# Patient Record
Sex: Female | Born: 1970 | Race: Black or African American | Hispanic: No | Marital: Single | Smoking: Never smoker
Health system: Southern US, Community
[De-identification: ages and names within clinical notes are randomized; demographics above are authoritative.]

## PROBLEM LIST (undated history)

## (undated) DIAGNOSIS — I10 Essential (primary) hypertension: Secondary | ICD-10-CM

## (undated) HISTORY — PX: CHOLECYSTECTOMY: SHX55

## (undated) HISTORY — PX: OTHER SURGICAL HISTORY: SHX169

---

## 2002-02-09 ENCOUNTER — Encounter (HOSPITAL_COMMUNITY): Admission: RE | Admit: 2002-02-09 | Discharge: 2002-03-11 | Payer: Self-pay | Admitting: Preventative Medicine

## 2002-07-26 ENCOUNTER — Emergency Department (HOSPITAL_COMMUNITY): Admission: EM | Admit: 2002-07-26 | Discharge: 2002-07-27 | Payer: Self-pay | Admitting: Emergency Medicine

## 2003-04-05 ENCOUNTER — Emergency Department (HOSPITAL_COMMUNITY): Admission: EM | Admit: 2003-04-05 | Discharge: 2003-04-05 | Payer: Self-pay | Admitting: Emergency Medicine

## 2003-07-23 ENCOUNTER — Encounter: Admission: RE | Admit: 2003-07-23 | Discharge: 2003-07-23 | Payer: Self-pay | Admitting: Family Medicine

## 2004-02-26 ENCOUNTER — Emergency Department (HOSPITAL_COMMUNITY): Admission: EM | Admit: 2004-02-26 | Discharge: 2004-02-26 | Payer: Self-pay | Admitting: *Deleted

## 2010-02-01 ENCOUNTER — Emergency Department (HOSPITAL_COMMUNITY): Admission: EM | Admit: 2010-02-01 | Discharge: 2010-02-01 | Payer: Self-pay | Admitting: Emergency Medicine

## 2010-06-24 ENCOUNTER — Encounter (INDEPENDENT_AMBULATORY_CARE_PROVIDER_SITE_OTHER): Payer: Self-pay | Admitting: *Deleted

## 2010-06-24 ENCOUNTER — Ambulatory Visit: Payer: Self-pay | Admitting: Internal Medicine

## 2010-06-24 LAB — CONVERTED CEMR LAB
BUN: 16 mg/dL (ref 6–23)
Basophils Relative: 0 % (ref 0–1)
Calcium: 9.2 mg/dL (ref 8.4–10.5)
Chloride: 104 meq/L (ref 96–112)
Creatinine, Ser: 0.75 mg/dL (ref 0.40–1.20)
Eosinophils Absolute: 0 10*3/uL (ref 0.0–0.7)
Eosinophils Relative: 1 % (ref 0–5)
HCT: 39.5 % (ref 36.0–46.0)
Hemoglobin: 12.9 g/dL (ref 12.0–15.0)
LDL Cholesterol: 86 mg/dL (ref 0–99)
MCHC: 32.7 g/dL (ref 30.0–36.0)
MCV: 91.4 fL (ref 78.0–100.0)
Monocytes Absolute: 0.4 10*3/uL (ref 0.1–1.0)
Monocytes Relative: 9 % (ref 3–12)
RBC: 4.32 M/uL (ref 3.87–5.11)
Triglycerides: 50 mg/dL (ref <150)

## 2010-12-09 LAB — URINALYSIS, ROUTINE W REFLEX MICROSCOPIC
Bilirubin Urine: NEGATIVE
Nitrite: POSITIVE — AB
Specific Gravity, Urine: 1.008 (ref 1.005–1.030)
pH: 7 (ref 5.0–8.0)

## 2010-12-09 LAB — URINE MICROSCOPIC-ADD ON

## 2010-12-09 LAB — URINE CULTURE

## 2011-08-23 ENCOUNTER — Emergency Department (HOSPITAL_COMMUNITY)
Admission: EM | Admit: 2011-08-23 | Discharge: 2011-08-23 | Disposition: A | Discharge status: Home / Self Care | Payer: Self-pay

## 2013-09-05 ENCOUNTER — Encounter (HOSPITAL_COMMUNITY): Payer: Self-pay | Admitting: Emergency Medicine

## 2013-09-05 ENCOUNTER — Emergency Department (HOSPITAL_COMMUNITY)
Admission: EM | Admit: 2013-09-05 | Discharge: 2013-09-05 | Disposition: A | Discharge status: Home / Self Care | Payer: Self-pay | Attending: Emergency Medicine | Admitting: Emergency Medicine

## 2013-09-05 DIAGNOSIS — H9319 Tinnitus, unspecified ear: Secondary | ICD-10-CM | POA: Insufficient documentation

## 2013-09-05 DIAGNOSIS — R05 Cough: Secondary | ICD-10-CM | POA: Insufficient documentation

## 2013-09-05 DIAGNOSIS — R599 Enlarged lymph nodes, unspecified: Secondary | ICD-10-CM | POA: Insufficient documentation

## 2013-09-05 DIAGNOSIS — K029 Dental caries, unspecified: Secondary | ICD-10-CM | POA: Insufficient documentation

## 2013-09-05 DIAGNOSIS — Z79899 Other long term (current) drug therapy: Secondary | ICD-10-CM | POA: Insufficient documentation

## 2013-09-05 DIAGNOSIS — R51 Headache: Secondary | ICD-10-CM | POA: Insufficient documentation

## 2013-09-05 DIAGNOSIS — H6093 Unspecified otitis externa, bilateral: Secondary | ICD-10-CM

## 2013-09-05 DIAGNOSIS — H60399 Other infective otitis externa, unspecified ear: Secondary | ICD-10-CM | POA: Insufficient documentation

## 2013-09-05 DIAGNOSIS — R059 Cough, unspecified: Secondary | ICD-10-CM | POA: Insufficient documentation

## 2013-09-05 MED ORDER — OFLOXACIN 0.3 % OT SOLN: 5.0000 [drp] | Freq: Two times a day (BID) | OTIC | Status: AC

## 2013-09-05 MED ORDER — HYDROCODONE-ACETAMINOPHEN 5-325 MG PO TABS: 1.0000 | ORAL_TABLET | ORAL | Status: DC | PRN

## 2013-09-05 MED ORDER — ANTIPYRINE-BENZOCAINE 5.4-1.4 % OT SOLN: 3.0000 [drp] | OTIC | Status: DC | PRN

## 2013-09-05 NOTE — Progress Notes (Signed)
   CARE MANAGEMENT ED NOTE 09/05/2013  Patient:  Vanessa Benitez,Vanessa Benitez   Account Number:  [id number]  Date Initiated:  09/05/2013  Documentation initiated by:  Radford Pax  Subjective/Objective Assessment:   Patient presents to Ed with swelling, itching, decreased hearing and tender ears.     Subjective/Objective Assessment Detail:     Action/Plan:   Action/Plan Detail:   Anticipated DC Date:       Status Recommendation to Physician:   Result of Recommendation:    Other ED Services  Consult Working Plan    DC Planning Services  Other  PCP issues    Choice offered to / List presented to:            Status of service:  Completed, signed off  ED Comments:   ED Comments Detail:  Patient confirms she does not have a pcp or insurance. [facility] provided patient with a list of pcps who accept self pay patients, list of financial assistance in the community such as local churches and [employer], urban ministries, list  discounted pharmacies, websites needymeds.org and Good Rx .com for medication assistance, information regarding Affordablle Care Act and Medicaid for insurance.  Also provided patient with phone number to inquire about the orange card and provided patient with discount RX card.  No further CM needs at this time.

## 2013-09-05 NOTE — ED Provider Notes (Signed)
CSN: [id number]     Arrival date & time 09/05/13  1952 History  This chart was scribed for non-physician practitioner Ruby Cola, PA-C, working with Shon Baton, MD by Dorothey Baseman, ED Scribe. This patient was seen in room WTR7/WTR7 and the patient's care was started at 9:38 PM.    Chief Complaint  Patient presents with  . Otalgia   The history is provided by the patient. No language interpreter was used.   HPI Comments: Vanessa Benitez is a 42 y.o. female who presents to the Emergency Department complaining of a constant, itching pain to the bilateral ears with associated decreased hearing, tinnitus, and swelling around the ears onset 4 days ago. She also reports an intermittent, stabbing pain to the bilateral upper dentition that she states may be due to her wisdom teeth. Patient reports a mild, intermittent cough and some sinus pressure to the forehead area. Patient reports an associated tactile fever and some mild rhinorrhea 4 days ago that has since resolved. She reports taking Benadryl at home without relief. Patient reports a history of ear infections that was treated with antibiotic ear drops. Patient reports that she does use Q-tips in the ears. She denies any ear drainage or sore throat. Patient denies any allergies. Patient has no other pertinent medical history.   History reviewed. No pertinent past medical history. Past Surgical History  Procedure Laterality Date  . Cholecystectomy    . Cyst removed     Family History  Problem Relation Age of Onset  . Hypertension Other   . Cancer Other   . Diabetes Other    History  Substance Use Topics  . Smoking status: Never Smoker   . Smokeless tobacco: Not on file  . Alcohol Use: Yes     Comment: rare   OB History   Grav Para Term Preterm Abortions TAB SAB Ect Mult Living                 Review of Systems  A complete 10 system review of systems was obtained and all systems are negative except as noted in the HPI  and PMH.   Allergies  Review of patient's allergies indicates no known allergies.  Home Medications   Current Outpatient Rx  Name  Route  Sig  Dispense  Refill  . diphenhydrAMINE (BENADRYL) 12.5 MG/5ML elixir   Oral   Take 25 mg by mouth 4 (four) times daily as needed (itching).         Marland Kitchen FLUoxetine (PROZAC) 20 MG capsule   Oral   Take 20 mg by mouth daily.         . traZODone (DESYREL) 50 MG tablet   Oral   Take 50 mg by mouth at bedtime.          Triage Vitals: BP 137/96  Pulse 94  Temp(Src) 98.7 F (37.1 C) (Oral)  Resp 22  Ht 5\' 2"  (1.575 m)  Wt 234 lb 2 oz (106.198 kg)  BMI 42.81 kg/m2  SpO2 98%  LMP 09/05/2013  Physical Exam  Nursing note and vitals reviewed. Constitutional: She is oriented to person, place, and time. She appears well-developed and well-nourished. No distress.  HENT:  Head: Normocephalic and atraumatic.  Tissue of EAC, particularly at outlet, is edematous and lumpy, almost with the appearance of cauliflower, bilaterally.  No erythema.  There is what appears to be dried drainage deep in canal on L; no active drainage.  L side is ttp.   TM nml  bilaterally.  No obvious edema/erythema around ear, including mastoid.  Post-auricular and cervical lymphadenopathy on right.  Tenderness bilateral TMJ.  Decay and mild tenderness bilateral upper 2nd molars.  No trismus but ROM of jaw decreased d/t pain.   Eyes:  Normal appearance  Neck: Normal range of motion.  Pulmonary/Chest: Effort normal.  Musculoskeletal: Normal range of motion.  Neurological: She is alert and oriented to person, place, and time.  Psychiatric: She has a normal mood and affect. Her behavior is normal.    ED Course  Procedures (including critical care time)  DIAGNOSTIC STUDIES: Oxygen Saturation is 98% on room air, normal by my interpretation.    COORDINATION OF CARE: 9:43 PM- Will discharge patient with a topical antibiotic to treat an external ear infection and pain  medication to manage symptoms. Advised patient to follow up with the referred ENT specialist. Return precautions given. Discussed treatment plan with patient at bedside and patient verbalized agreement.     Labs Review Labs Reviewed - No data to display Imaging Review No results found.  EKG Interpretation   None       MDM   1. Otitis externa, bilateral    Healthy 42yo F presents w/ bilateral ear pain/pruritis that is aggravated by ROM of jaw and associated w/ hearing impairment.  Has chronic pruritis of ears and frequently scratches w/ fingers or a q-tip.  On exam, lumpy edema and what appears to be crusted drainage bilateral EAC.  No erythema of skin.  Cervical and post-auricular lymphadenopathy on right.  No trismus but limited ROM of jaw d/t ear pain, as well as tenderness of bilateral upper molars and TMJ.  Suspect that patient has otitis externa, likely from digital trauma, but exam findings almost appear chronic, particularly because of lack of erythema and active drainage.  Recommended f/u w/ ENT.  Prescribed ofloxacin, auralgan and hydrocodone.  Return precautions discussed.    I personally performed the services described in this documentation, which was scribed in my presence. The recorded information has been reviewed and is accurate.     Otilio Miu, PA-C 09/05/13 2244

## 2013-09-05 NOTE — ED Provider Notes (Signed)
Medical screening examination/treatment/procedure(s) were performed by non-physician practitioner and as supervising physician I was immediately available for consultation/collaboration.  EKG Interpretation   None        Karna Abed F Deneka Greenwalt, MD 09/05/13 2329 

## 2013-09-05 NOTE — ED Notes (Signed)
Pt is c/o bilateral earache  Pt states it started on Friday  Pt states she is having a hard time hearing and states her face is swollen

## 2013-10-28 ENCOUNTER — Emergency Department (HOSPITAL_COMMUNITY)
Admission: EM | Admit: 2013-10-28 | Discharge: 2013-10-28 | Disposition: A | Discharge status: Home / Self Care | Payer: Self-pay | Attending: Emergency Medicine | Admitting: Emergency Medicine

## 2013-10-28 ENCOUNTER — Encounter (HOSPITAL_COMMUNITY): Payer: Self-pay | Admitting: Emergency Medicine

## 2013-10-28 DIAGNOSIS — R350 Frequency of micturition: Secondary | ICD-10-CM | POA: Insufficient documentation

## 2013-10-28 DIAGNOSIS — J029 Acute pharyngitis, unspecified: Secondary | ICD-10-CM | POA: Insufficient documentation

## 2013-10-28 DIAGNOSIS — H6690 Otitis media, unspecified, unspecified ear: Secondary | ICD-10-CM | POA: Insufficient documentation

## 2013-10-28 DIAGNOSIS — H5789 Other specified disorders of eye and adnexa: Secondary | ICD-10-CM | POA: Insufficient documentation

## 2013-10-28 DIAGNOSIS — H6691 Otitis media, unspecified, right ear: Secondary | ICD-10-CM

## 2013-10-28 DIAGNOSIS — R630 Anorexia: Secondary | ICD-10-CM | POA: Insufficient documentation

## 2013-10-28 DIAGNOSIS — R110 Nausea: Secondary | ICD-10-CM | POA: Insufficient documentation

## 2013-10-28 LAB — RAPID STREP SCREEN (MED CTR MEBANE ONLY): Streptococcus, Group A Screen (Direct): NEGATIVE

## 2013-10-28 MED ORDER — AMOXICILLIN 500 MG PO CAPS: 500.0000 mg | ORAL_CAPSULE | Freq: Three times a day (TID) | ORAL | Status: DC

## 2013-10-28 MED ORDER — GUAIFENESIN-CODEINE 100-10 MG/5ML PO SYRP: 5.0000 mL | ORAL_SOLUTION | Freq: Three times a day (TID) | ORAL | Status: DC | PRN

## 2013-10-28 NOTE — ED Notes (Signed)
Pt c/o sore throat, body aches and bil ear pain since Tuesday. No fevers. Theraflu PTA.

## 2013-10-28 NOTE — ED Provider Notes (Signed)
CSN: [id number]     Arrival date & time 10/28/13  1618 History  This chart was scribed for non-physician practitioner Ashley Murrain, NP working with Elmer Sow, MD by Anastasia Pall, ED scribe. This patient was seen in room TR05C/TR05C and the patient's care was started at 7:19 PM.    Chief Complaint  Patient presents with  . Sore Throat  . Generalized Body Aches  . Otalgia   (Consider location/radiation/quality/duration/timing/severity/associated sxs/prior Treatment) The history is provided by the patient. No language interpreter was used.   HPI Comments: Vanessa Benitez is a 43 y.o. female who presents to the Emergency Department complaining of constant sore throat, onset 5 days ago, with a pain severity of 8/10 when swallowing. She reports associated fever 5 days ago in the evening around 6:00pm, but states it subsided the next day. She reports nausea 4 days ago, with decreased appetite. She reports diarrhea 3 days ago, but states it subsided after 24 hours. She also reports associated nasal congestion, intermittent sneezing, with dark yellow/bloody discharge, intermittent cough, bilateral ear pain, bilateral eye redness, and urinary frequency. She reports h/o recurring cough. She states her right ear is more painful than the left. She reports her daughter recently became sick, but did not need antibiotics. She reports her boss recently came down with strep. She denies blurry vision, eye discharge, vomiting, abdominal pain, dysuria, and any other associated symptoms. She denies any known allergies.   PCP - No primary provider on file.  History reviewed. No pertinent past medical history. Past Surgical History  Procedure Laterality Date  . Cholecystectomy    . Cyst removed     Family History  Problem Relation Age of Onset  . Hypertension Other   . Cancer Other   . Diabetes Other    History  Substance Use Topics  . Smoking status: Never Smoker   . Smokeless tobacco: Not on file  .  Alcohol Use: Yes     Comment: rare   OB History   Grav Para Term Preterm Abortions TAB SAB Ect Mult Living                 Review of Systems  Constitutional: Positive for fever and appetite change (decreased).  HENT: Positive for congestion (nasal), ear pain (bilateral), sneezing and sore throat.   Eyes: Positive for redness (bilateral). Negative for pain, discharge and visual disturbance.  Respiratory: Positive for cough.   Cardiovascular: Negative for chest pain.  Gastrointestinal: Positive for nausea and diarrhea (a few days ago but has resolved). Negative for vomiting and abdominal pain.  Genitourinary: Positive for frequency. Negative for dysuria and decreased urine volume.  Skin: Negative for rash.  Neurological: Negative for dizziness and headaches.  Psychiatric/Behavioral: Negative for confusion.    Allergies  Review of patient's allergies indicates no known allergies.  Home Medications   No current outpatient prescriptions on file. BP 128/82  Pulse 102  Temp(Src) 98.3 F (36.8 C) (Oral)  Resp 18  Wt 230 lb (104.327 kg)  SpO2 100%  LMP 10/06/2013  Physical Exam  Nursing note and vitals reviewed. Constitutional: She is oriented to person, place, and time. She appears well-developed and well-nourished. No distress.  HENT:  Head: Normocephalic and atraumatic.  Right Ear: External ear normal. Tympanic membrane is erythematous and retracted.  Left Ear: Tympanic membrane, external ear and ear canal normal. Tympanic membrane is not erythematous and not retracted.  Nose: Nose normal.  Mouth/Throat: Uvula is midline. Posterior oropharyngeal erythema present. No oropharyngeal  exudate.  Eyes: Conjunctivae and EOM are normal. Pupils are equal, round, and reactive to light. No scleral icterus.  Neck: Neck supple. No tracheal deviation present. No thyromegaly present.  Cardiovascular: Normal rate, regular rhythm and normal heart sounds.   No murmur heard. Pulmonary/Chest:  Effort normal and breath sounds normal. No respiratory distress. She has no wheezes. She has no rales.  Abdominal: Soft. There is no tenderness. There is no CVA tenderness.  Musculoskeletal: Normal range of motion.  Lymphadenopathy:    She has cervical adenopathy (greater on right than left).  Neurological: She is alert and oriented to person, place, and time.  Skin: Skin is warm and dry.  Psychiatric: She has a normal mood and affect. Her behavior is normal.    ED Course  Procedures (including critical care time)  DIAGNOSTIC STUDIES: Oxygen Saturation is 100% on room air, normal by my interpretation.    COORDINATION OF CARE: 7:25 PM-Discussed treatment plan which includes strep screen, antibiotic, and cough medication with pt at bedside and pt agreed to plan.   Results for orders placed during the hospital encounter of 10/28/13  RAPID STREP SCREEN      Result Value Range   Streptococcus, Group A Screen (Direct) NEGATIVE  NEGATIVE    MDM  43 y.o. female with sore throat, ear ache and generalized body aches x 4 days. I have reviewed this patient's vital signs, nurses notes, appropriate labs and discussed findings and plan of care with the patient. She voices understanding. Stable for discharge without further screening. Will treat otitis and URI.    Medication List         amoxicillin 500 MG capsule  Commonly known as:  AMOXIL  Take 1 capsule (500 mg total) by mouth 3 (three) times daily.     guaiFENesin-codeine 100-10 MG/5ML syrup  Commonly known as:  ROBITUSSIN AC  Take 5 mLs by mouth 3 (three) times daily as needed for cough.      I personally performed the services described in this documentation, which was scribed in my presence. The recorded information has been reviewed and is accurate.   [address] [place] H[place], [place] 10/29/13 [phone]

## 2013-10-28 NOTE — ED Notes (Signed)
The pt remains in peds with her child

## 2013-10-29 NOTE — ED Provider Notes (Signed)
Medical screening examination/treatment/procedure(s) were performed by non-physician practitioner and as supervising physician I was immediately available for consultation/collaboration.  EKG Interpretation   None         Elmer Sow, MD 10/29/13 [phone]

## 2013-10-31 LAB — CULTURE, GROUP A STREP

## 2014-05-23 ENCOUNTER — Encounter (HOSPITAL_COMMUNITY): Payer: Self-pay | Admitting: Emergency Medicine

## 2014-05-23 ENCOUNTER — Emergency Department (HOSPITAL_COMMUNITY)
Admission: EM | Admit: 2014-05-23 | Discharge: 2014-05-24 | Disposition: A | Discharge status: Home / Self Care | Payer: Self-pay | Attending: Emergency Medicine | Admitting: Emergency Medicine

## 2014-05-23 ENCOUNTER — Emergency Department (HOSPITAL_COMMUNITY): Payer: Self-pay

## 2014-05-23 DIAGNOSIS — J159 Unspecified bacterial pneumonia: Secondary | ICD-10-CM | POA: Insufficient documentation

## 2014-05-23 DIAGNOSIS — R509 Fever, unspecified: Secondary | ICD-10-CM | POA: Insufficient documentation

## 2014-05-23 DIAGNOSIS — M549 Dorsalgia, unspecified: Secondary | ICD-10-CM | POA: Insufficient documentation

## 2014-05-23 DIAGNOSIS — J189 Pneumonia, unspecified organism: Secondary | ICD-10-CM

## 2014-05-23 MED ORDER — IBUPROFEN 800 MG PO TABS
800.0000 mg | ORAL_TABLET | Freq: Once | ORAL | Status: AC
  Administered 2014-05-23: 800 mg via ORAL
  Filled 2014-05-23: qty 1

## 2014-05-23 MED ORDER — ACETAMINOPHEN 325 MG PO TABS
650.0000 mg | ORAL_TABLET | Freq: Once | ORAL | Status: AC
  Administered 2014-05-23: 650 mg via ORAL
  Filled 2014-05-23: qty 2

## 2014-05-23 NOTE — ED Notes (Signed)
Pt states that she began to have body aches, cough, chills, lower back pain that began on Thurs; pt states it is a dry cough

## 2014-05-24 LAB — CBC WITH DIFFERENTIAL/PLATELET
Basophils Absolute: 0 10*3/uL (ref 0.0–0.1)
Basophils Relative: 0 % (ref 0–1)
Eosinophils Absolute: 0 10*3/uL (ref 0.0–0.7)
Eosinophils Relative: 0 % (ref 0–5)
HCT: 40.9 % (ref 36.0–46.0)
HEMOGLOBIN: 13.9 g/dL (ref 12.0–15.0)
LYMPHS ABS: 1.3 10*3/uL (ref 0.7–4.0)
Lymphocytes Relative: 14 % (ref 12–46)
MCH: 29.8 pg (ref 26.0–34.0)
MCHC: 34 g/dL (ref 30.0–36.0)
MCV: 87.6 fL (ref 78.0–100.0)
Monocytes Absolute: 0.6 10*3/uL (ref 0.1–1.0)
Monocytes Relative: 6 % (ref 3–12)
NEUTROS PCT: 80 % — AB (ref 43–77)
Neutro Abs: 7.4 10*3/uL (ref 1.7–7.7)
Platelets: 227 10*3/uL (ref 150–400)
RBC: 4.67 MIL/uL (ref 3.87–5.11)
RDW: 13.6 % (ref 11.5–15.5)
WBC: 9.3 10*3/uL (ref 4.0–10.5)

## 2014-05-24 LAB — BASIC METABOLIC PANEL
Anion gap: 20 — ABNORMAL HIGH (ref 5–15)
BUN: 15 mg/dL (ref 6–23)
CO2: 20 meq/L (ref 19–32)
Calcium: 9.6 mg/dL (ref 8.4–10.5)
Chloride: 94 mEq/L — ABNORMAL LOW (ref 96–112)
Creatinine, Ser: 1.02 mg/dL (ref 0.50–1.10)
GFR calc Af Amer: 78 mL/min — ABNORMAL LOW (ref >90)
GFR calc non Af Amer: 67 mL/min — ABNORMAL LOW (ref >90)
Glucose, Bld: 114 mg/dL — ABNORMAL HIGH (ref 70–99)
POTASSIUM: 3.5 meq/L — AB (ref 3.7–5.3)
Sodium: 134 mEq/L — ABNORMAL LOW (ref 137–147)

## 2014-05-24 LAB — I-STAT CG4 LACTIC ACID, ED: Lactic Acid, Venous: 0.8 mmol/L (ref 0.5–2.2)

## 2014-05-24 MED ORDER — MORPHINE SULFATE 4 MG/ML IJ SOLN
4.0000 mg | Freq: Once | INTRAMUSCULAR | Status: DC
  Filled 2014-05-24: qty 1

## 2014-05-24 MED ORDER — FLUCONAZOLE 150 MG PO TABS: 150.0000 mg | ORAL_TABLET | Freq: Once | ORAL | Status: DC

## 2014-05-24 MED ORDER — HYDROCODONE-HOMATROPINE 5-1.5 MG/5ML PO SYRP: 5.0000 mL | ORAL_SOLUTION | Freq: Four times a day (QID) | ORAL | Status: DC | PRN

## 2014-05-24 MED ORDER — ONDANSETRON HCL 4 MG/2ML IJ SOLN
4.0000 mg | Freq: Once | INTRAMUSCULAR | Status: DC
  Filled 2014-05-24: qty 2

## 2014-05-24 MED ORDER — SODIUM CHLORIDE 0.9 % IV BOLUS (SEPSIS)
1000.0000 mL | Freq: Once | INTRAVENOUS | Status: AC
  Administered 2014-05-24: 1000 mL via INTRAVENOUS

## 2014-05-24 MED ORDER — LEVOFLOXACIN 500 MG PO TABS: 500.0000 mg | ORAL_TABLET | Freq: Every day | ORAL | Status: DC

## 2014-05-24 MED ORDER — LEVOFLOXACIN IN D5W 750 MG/150ML IV SOLN
750.0000 mg | Freq: Once | INTRAVENOUS | Status: AC
  Administered 2014-05-24: 750 mg via INTRAVENOUS
  Filled 2014-05-24: qty 150

## 2014-05-24 NOTE — ED Notes (Signed)
Family at bedside. 

## 2014-05-24 NOTE — Discharge Instructions (Signed)
Take Levaquin as directed until gone. Take hycodan as needed for cough. Refer to attached documents for more information. Return to the ED with worsening or concerning symptoms. Refer to attached documents for more information.

## 2014-05-24 NOTE — ED Provider Notes (Signed)
Medical screening examination/treatment/procedure(s) were performed by non-physician practitioner and as supervising physician I was immediately available for consultation/collaboration.   EKG Interpretation None       Kalman Drape, MD 05/24/14 [phone]

## 2014-05-24 NOTE — ED Provider Notes (Signed)
CSN: [id number]     Arrival date & time 05/23/14  2135 History   First MD Initiated Contact with Patient 05/23/14 2351     Chief Complaint  Patient presents with  . Cough  . Fever     (Consider location/radiation/quality/duration/timing/severity/associated sxs/prior Treatment) HPI Comments: Patient is a 43 year old female with no past medical history who presents with a 1 week history of cough and fever. Symptoms started gradually and progressively worsened since the onset. The cough is dry and hacking. She is unsure how high her fever has been at home. She tried OTC medication for symptoms without relief. She reports associated body aches, chills, and lower back pain. No aggravating/alleviating factors. Patient's husband is present who reports he was admitted to the hospital about 3 weeks ago for heart failure and contracted pneumonia during his admission. He thinks he may have passed the infection to his wife.    History reviewed. No pertinent past medical history. Past Surgical History  Procedure Laterality Date  . Cholecystectomy    . Cyst removed     Family History  Problem Relation Age of Onset  . Hypertension Other   . Cancer Other   . Diabetes Other    History  Substance Use Topics  . Smoking status: Never Smoker   . Smokeless tobacco: Not on file  . Alcohol Use: Yes     Comment: rare   OB History   Grav Para Term Preterm Abortions TAB SAB Ect Mult Living                 Review of Systems  Constitutional: Positive for fever. Negative for chills and fatigue.  HENT: Negative for trouble swallowing.   Eyes: Negative for visual disturbance.  Respiratory: Positive for cough. Negative for shortness of breath.   Cardiovascular: Negative for chest pain and palpitations.  Gastrointestinal: Negative for nausea, vomiting, abdominal pain and diarrhea.  Genitourinary: Negative for dysuria and difficulty urinating.  Musculoskeletal: Positive for back pain. Negative for  arthralgias and neck pain.  Skin: Negative for color change.  Neurological: Negative for dizziness and weakness.  Psychiatric/Behavioral: Negative for dysphoric mood.      Allergies  Review of patient's allergies indicates no known allergies.  Home Medications   Prior to Admission medications   Medication Sig Start Date End Date Taking? Authorizing Provider  aspirin-acetaminophen-caffeine (EXCEDRIN MIGRAINE) [phone] MG per tablet Take 1 tablet by mouth every 6 (six) hours as needed for headache.   Yes Historical Provider, MD  Pseudoeph-Doxylamine-DM-APAP (NYQUIL PO) Take 30 mLs by mouth every 4 (four) hours as needed (flu-like symptoms).   Yes Historical Provider, MD   BP 146/97  Pulse 127  Temp(Src) 100.3 F (37.9 C) (Oral)  Resp 20  SpO2 99%  LMP 05/06/2014 Physical Exam  Nursing note and vitals reviewed. Constitutional: She appears well-developed and well-nourished. No distress.  Patient appears uncomfortable.   HENT:  Head: Normocephalic and atraumatic.  Eyes: Conjunctivae and EOM are normal.  Neck: Normal range of motion.  Cardiovascular: Regular rhythm.  Exam reveals no gallop and no friction rub.   No murmur heard. tachycardic  Pulmonary/Chest: Effort normal and breath sounds normal. She has no wheezes. She has no rales. She exhibits no tenderness.  Abdominal: Soft. She exhibits no distension. There is no tenderness. There is no rebound.  Musculoskeletal: Normal range of motion.  Neurological: She is alert. Coordination normal.  Speech is goal-oriented. Moves limbs without ataxia.   Skin: Skin is warm and dry.  Psychiatric: She has a normal mood and affect. Her behavior is normal.    ED Course  Procedures (including critical care time) Labs Review Labs Reviewed  CBC WITH DIFFERENTIAL - Abnormal; Notable for the following:    Neutrophils Relative % 80 (*)    All other components within normal limits  BASIC METABOLIC PANEL - Abnormal; Notable for the  following:    Sodium 134 (*)    Potassium 3.5 (*)    Chloride 94 (*)    Glucose, Bld 114 (*)    GFR calc non Af Amer 67 (*)    GFR calc Af Amer 78 (*)    Anion gap 20 (*)    All other components within normal limits  I-STAT CG4 LACTIC ACID, ED    Imaging Review Dg Chest 2 View  05/24/2014   CLINICAL DATA:  rule out pneumonia.  Fever and chest pressure.  EXAM: CHEST  2 VIEW  COMPARISON:  None.  FINDINGS: Mild left lower lobe/ retrocardiac opacity. No pleural effusion or pneumothorax. Cardiomediastinal contours within normal range. No acute osseous finding. Surgical clips right upper quadrant.  IMPRESSION: Mild left lower lobe opacity may reflect atelectasis or pneumonia.   Electronically Signed   By: Carlos Levering M.D.   On: 05/24/2014 00:21     EKG Interpretation None      MDM   Final diagnoses:  CAP (community acquired pneumonia)    12:54 AM Patient is tachycardic and febrile at this time. Patient's chest xray shows pneumonia. Patient given tylenol and ibuprofen here. Patient will have IV fluids, IV levaquin, and labs.   5:36 AM Patient's labs show increase in anion gap likely due to dehydration. Patient reports improvement of symptoms after receiving fluids and IV levaquin. No elevation of lactic acid. Patient will be discharged with levaquin and hycodan. Patient instructed to drink plenty of fluids and return to the ED with worsening or concerning symptoms.   Alvina Chou, [place] 05/24/14 [phone]

## 2015-01-18 ENCOUNTER — Emergency Department (HOSPITAL_COMMUNITY)
Admission: EM | Admit: 2015-01-18 | Discharge: 2015-01-19 | Disposition: A | Discharge status: Home / Self Care | Payer: Medicaid Other | Attending: Emergency Medicine | Admitting: Emergency Medicine

## 2015-01-18 ENCOUNTER — Encounter (HOSPITAL_COMMUNITY): Payer: Self-pay | Admitting: Emergency Medicine

## 2015-01-18 DIAGNOSIS — R0602 Shortness of breath: Secondary | ICD-10-CM | POA: Insufficient documentation

## 2015-01-18 DIAGNOSIS — I10 Essential (primary) hypertension: Secondary | ICD-10-CM | POA: Diagnosis not present

## 2015-01-18 DIAGNOSIS — R2243 Localized swelling, mass and lump, lower limb, bilateral: Secondary | ICD-10-CM | POA: Diagnosis not present

## 2015-01-18 DIAGNOSIS — R51 Headache: Secondary | ICD-10-CM | POA: Diagnosis present

## 2015-01-18 HISTORY — DX: Essential (primary) hypertension: I10

## 2015-01-18 LAB — BASIC METABOLIC PANEL
ANION GAP: 6 (ref 5–15)
BUN: 13 mg/dL (ref 6–23)
CO2: 23 mmol/L (ref 19–32)
CREATININE: 0.71 mg/dL (ref 0.50–1.10)
Calcium: 9 mg/dL (ref 8.4–10.5)
Chloride: 108 mmol/L (ref 96–112)
GFR calc Af Amer: >90 mL/min (ref >90)
Glucose, Bld: 95 mg/dL (ref 70–99)
POTASSIUM: 3.7 mmol/L (ref 3.5–5.1)
SODIUM: 137 mmol/L (ref 135–145)

## 2015-01-18 LAB — CBC
HEMATOCRIT: 38 % (ref 36.0–46.0)
HEMOGLOBIN: 12.6 g/dL (ref 12.0–15.0)
MCH: 29.7 pg (ref 26.0–34.0)
MCHC: 33.2 g/dL (ref 30.0–36.0)
MCV: 89.6 fL (ref 78.0–100.0)
PLATELETS: 223 10*3/uL (ref 150–400)
RBC: 4.24 MIL/uL (ref 3.87–5.11)
RDW: 14.3 % (ref 11.5–15.5)
WBC: 4.8 10*3/uL (ref 4.0–10.5)

## 2015-01-18 LAB — I-STAT TROPONIN, ED: Troponin i, poc: 0 ng/mL (ref 0.00–0.08)

## 2015-01-18 LAB — CBG MONITORING, ED: Glucose-Capillary: 103 mg/dL — ABNORMAL HIGH (ref 70–99)

## 2015-01-18 NOTE — ED Notes (Signed)
CBG registered 103 ON ED Glucometer

## 2015-01-18 NOTE — ED Notes (Signed)
Pt from home c/o hypertension, headache, bilateral leg edema, and shoulder blade pressure. Pt reports she has been under  Stress due to passing of mother. Pt reports heaviness in arms, all symptoms x 6 months.

## 2015-01-19 ENCOUNTER — Emergency Department (HOSPITAL_COMMUNITY): Payer: Medicaid Other

## 2015-01-19 LAB — BRAIN NATRIURETIC PEPTIDE: B Natriuretic Peptide: 12.2 pg/mL (ref 0.0–100.0)

## 2015-01-19 LAB — D-DIMER, QUANTITATIVE: D-Dimer, Quant: 0.61 ug/mL-FEU — ABNORMAL HIGH (ref 0.00–0.48)

## 2015-01-19 LAB — TROPONIN I: Troponin I: <0.03 ng/mL (ref <0.031)

## 2015-01-19 MED ORDER — IOHEXOL 350 MG/ML SOLN
100.0000 mL | Freq: Once | INTRAVENOUS | Status: AC | PRN
  Administered 2015-01-19: 100 mL via INTRAVENOUS

## 2015-01-19 NOTE — ED Notes (Signed)
Patient transported to CT 

## 2015-01-19 NOTE — ED Notes (Addendum)
Pt transported to Xray. 

## 2015-01-19 NOTE — ED Provider Notes (Signed)
CSN: [id number]     Arrival date & time 01/18/15  1912 History   First MD Initiated Contact with Patient 01/18/15 2357     Chief Complaint  Patient presents with  . Hypertension  . Headache  . Leg Swelling     (Consider location/radiation/quality/duration/timing/severity/associated sxs/prior Treatment) HPI  Vanessa Benitez is a 44 y.o. female with past medical history of hypertension presenting today with multiple complaints. Patient states she has had elevated blood pressures at home and a headache as well. She describes chest pressure that radiates to her bilateral shoulder blades. She has had shortness of breath and sleep orthopnea. Patient also describes bilateral lower extremity edema worse on the left side. This is on the setting of recently losing her mother, she believes it may be stress related. Patient denies any exertional component to her symptoms. Her sleep orthopnea has worsened over the last couple of months. She denies any long distance travel, recent surgeries, estrogen use, or history of pulmonary embolism. Patient has no further complaints.  10 Systems reviewed and are negative for acute change except as noted in the HPI.    Past Medical History  Diagnosis Date  . Hypertension    Past Surgical History  Procedure Laterality Date  . Cholecystectomy    . Cyst removed     Family History  Problem Relation Age of Onset  . Hypertension Other   . Cancer Other   . Diabetes Other    History  Substance Use Topics  . Smoking status: Never Smoker   . Smokeless tobacco: Not on file  . Alcohol Use: Yes     Comment: rare   OB History    No data available     Review of Systems    Allergies  Lactose intolerance (gi)  Home Medications   Prior to Admission medications   Medication Sig Start Date End Date Taking? Authorizing Provider  fluconazole (DIFLUCAN) 150 MG tablet Take 1 tablet (150 mg total) by mouth once. Patient not taking: Reported on 01/18/2015 05/24/14    Alvina Chou, PA-C  HYDROcodone-homatropine [facility]) 5-1.5 MG/5ML syrup Take 5 mLs by mouth every 6 (six) hours as needed for cough. Patient not taking: Reported on 01/18/2015 05/24/14   Alvina Chou, PA-C  levofloxacin (LEVAQUIN) 500 MG tablet Take 1 tablet (500 mg total) by mouth daily. Patient not taking: Reported on 01/18/2015 05/24/14   Alvina Chou, PA-C   BP 156/116 mmHg  Pulse 65  Temp(Src) 99 F (37.2 C) (Oral)  Resp 16  SpO2 72%  LMP 12/28/2014 Physical Exam  Constitutional: She is oriented to person, place, and time. She appears well-developed and well-nourished. No distress.  HENT:  Head: Normocephalic and atraumatic.  Nose: Nose normal.  Mouth/Throat: Oropharynx is clear and moist. No oropharyngeal exudate.  Eyes: Conjunctivae and EOM are normal. Pupils are equal, round, and reactive to light. No scleral icterus.  Neck: Normal range of motion. Neck supple. No JVD present. No tracheal deviation present. No thyromegaly present.  Cardiovascular: Normal rate, regular rhythm and normal heart sounds.  Exam reveals no gallop and no friction rub.   No murmur heard. Pulmonary/Chest: Effort normal and breath sounds normal. No respiratory distress. She has no wheezes. She exhibits no tenderness.  Abdominal: Soft. Bowel sounds are normal. She exhibits no distension and no mass. There is no tenderness. There is no rebound and no guarding.  Musculoskeletal: Normal range of motion. She exhibits edema. She exhibits no tenderness.  Lymphadenopathy:    She has no  cervical adenopathy.  Neurological: She is alert and oriented to person, place, and time. No cranial nerve deficit. She exhibits normal muscle tone.  Skin: Skin is warm and dry. No rash noted. She is not diaphoretic. No erythema. No pallor.  Nursing note and vitals reviewed.   ED Course  Procedures (including critical care time) Labs Review Labs Reviewed  D-DIMER, QUANTITATIVE - Abnormal; Notable for the following:     D-Dimer, Quant 0.61 (*)    All other components within normal limits  CBG MONITORING, ED - Abnormal; Notable for the following:    Glucose-Capillary 103 (*)    All other components within normal limits  CBC  BASIC METABOLIC PANEL  BRAIN NATRIURETIC PEPTIDE  TROPONIN I  Randolm Idol, ED    Imaging Review Dg Chest 2 View  01/19/2015   CLINICAL DATA:  Sternal chest tightness. Hypertension. Headache. Shortness of breath.  EXAM: CHEST  2 VIEW  COMPARISON:  05/23/2014  FINDINGS: Numerous leads and wires project over the chest. Midline trachea. Normal heart size and mediastinal contours.Sharp costophrenic angles. No pneumothorax. Clear lungs.  IMPRESSION: No active cardiopulmonary disease.   Electronically Signed   By: Abigail Miyamoto M.D.   On: 01/19/2015 02:01   Ct Angio Chest Pe W/cm &/or Wo Cm  01/19/2015   CLINICAL DATA:  Headache, bilateral lower extremity edema, shoulder pressure for 6 months. History of hypertension and stress.  EXAM: CT ANGIOGRAPHY CHEST WITH CONTRAST  TECHNIQUE: Multidetector CT imaging of the chest was performed using the standard protocol during bolus administration of intravenous contrast. Multiplanar CT image reconstructions and MIPs were obtained to evaluate the vascular anatomy.  CONTRAST:  163mL OMNIPAQUE IOHEXOL 350 MG/ML SOLN  COMPARISON:  Chest radiograph January 19, 2015 at 1630 hours  FINDINGS: PULMONARY ARTERY: Adequate contrast opacification of the pulmonary artery's. Main pulmonary artery is not enlarged. No pulmonary arterial filling defects to the level of the subsegmental branches.  MEDIASTINUM: Heart and pericardium are unremarkable, no right heart strain. Thoracic aorta is normal course and caliber, unremarkable. No lymphadenopathy by CT size criteria.  LUNGS: Tracheobronchial tree is patent, no pneumothorax. No pleural effusions, focal consolidations, pulmonary nodules or masses.  SOFT TISSUES AND OSSEOUS STRUCTURES: Small hiatal hernia. Visualized soft  tissues and included osseous structures appear normal.  Review of the MIP images confirms the above findings.  IMPRESSION: No acute pulmonary embolism nor acute cardiopulmonary process.   Electronically Signed   By: Elon Alas   On: 01/19/2015 04:23     EKG Interpretation   Date/Time:  Friday January 18 2015 19:52:19 EDT Ventricular Rate:  81 PR Interval:  153 QRS Duration: 79 QT Interval:  526 QTC Calculation: 611 R Axis:   34 Text Interpretation:  Sinus rhythm Borderline abnrm T, anterolateral leads  Prolonged QT interval No old tracing to compare Confirmed by Winfred Leeds   MD, SAM [phone]) on 01/18/2015 8:17:29 PM      MDM   Final diagnoses:  SOB (shortness of breath)    Patient since emergency department for signs and symptoms concerning for CHF versus pulmonary embolism. Swelling is bilateral so I doubt DVTs in both legs. Will obtain d-dimer to evaluate for possible PE. Patient will also be ruled out using the heart score. She is low risk with her history. Initial EKG does not show signs of ischemia, troponin is negative. Heart score is currently 2.  Repeat troponin is currently pending.  Repeat troponin is negative. Heart score remains 2. Patient is safe for discharge in  regards to ACS. CT scan is negative for pulmonary embolism, which was obtained due to a positive d-dimer. BMP is also normal. This can be a false-negative as the patient is obese. Patient be advised to see primary care physician regarding her symptoms within 3 days. Her vital signs were within her normal limits and she is safe for discharge.  Everlene Balls, MD 01/19/15 [phone]

## 2015-01-19 NOTE — Discharge Instructions (Signed)
Shortness of Breath Ms. Vanessa Benitez, see her primary care physician within 3 days for close follow-up. Your CT scan did not show any blood clots. If any symptoms worsen come back to emergency department immediately. Thank you. Shortness of breath means you have trouble breathing. Shortness of breath needs medical care right away. HOME CARE   Do not smoke.  Avoid being around chemicals or things (paint fumes, dust) that may bother your breathing.  Rest as needed. Slowly begin your normal activities.  Only take medicines as told by your doctor.  Keep all doctor visits as told. GET HELP RIGHT AWAY IF:   Your shortness of breath gets worse.  You feel lightheaded, pass out (faint), or have a cough that is not helped by medicine.  You cough up blood.  You have pain with breathing.  You have pain in your chest, arms, shoulders, or belly (abdomen).  You have a fever.  You cannot walk up stairs or exercise the way you normally do.  You do not get better in the time expected.  You have a hard time doing normal activities even with rest.  You have problems with your medicines.  You have any new symptoms. MAKE SURE YOU:  Understand these instructions.  Will watch your condition.  Will get help right away if you are not doing well or get worse. Document Released: 02/24/2008 Document Revised: 09/12/2013 Document Reviewed: 11/23/2011 [facility] Patient Information 2015 [place], [place]. This information is not intended to replace advice given to you by your health care provider. Make sure you discuss any questions you have with your health care provider.

## 2016-03-15 ENCOUNTER — Emergency Department (HOSPITAL_COMMUNITY): Payer: Self-pay

## 2016-03-15 ENCOUNTER — Emergency Department (HOSPITAL_COMMUNITY)
Admission: EM | Admit: 2016-03-15 | Discharge: 2016-03-15 | Disposition: A | Discharge status: Home / Self Care | Payer: Self-pay | Attending: Emergency Medicine | Admitting: Emergency Medicine

## 2016-03-15 ENCOUNTER — Encounter (HOSPITAL_COMMUNITY): Payer: Self-pay

## 2016-03-15 DIAGNOSIS — D1779 Benign lipomatous neoplasm of other sites: Secondary | ICD-10-CM | POA: Insufficient documentation

## 2016-03-15 DIAGNOSIS — M79671 Pain in right foot: Secondary | ICD-10-CM | POA: Insufficient documentation

## 2016-03-15 DIAGNOSIS — S9031XA Contusion of right foot, initial encounter: Secondary | ICD-10-CM

## 2016-03-15 DIAGNOSIS — S63502A Unspecified sprain of left wrist, initial encounter: Secondary | ICD-10-CM | POA: Insufficient documentation

## 2016-03-15 DIAGNOSIS — Y999 Unspecified external cause status: Secondary | ICD-10-CM | POA: Insufficient documentation

## 2016-03-15 DIAGNOSIS — Y929 Unspecified place or not applicable: Secondary | ICD-10-CM | POA: Insufficient documentation

## 2016-03-15 DIAGNOSIS — I10 Essential (primary) hypertension: Secondary | ICD-10-CM | POA: Insufficient documentation

## 2016-03-15 DIAGNOSIS — W228XXA Striking against or struck by other objects, initial encounter: Secondary | ICD-10-CM | POA: Insufficient documentation

## 2016-03-15 DIAGNOSIS — Y939 Activity, unspecified: Secondary | ICD-10-CM | POA: Insufficient documentation

## 2016-03-15 DIAGNOSIS — D171 Benign lipomatous neoplasm of skin and subcutaneous tissue of trunk: Secondary | ICD-10-CM

## 2016-03-15 NOTE — ED Notes (Signed)
Patient Alert and oriented X4. Stable and ambulatory. Patient verbalized understanding of the discharge instructions.  Patient belongings were taken by the patient. Splints and crutches applied prior to discharge by ortho tech

## 2016-03-15 NOTE — ED Provider Notes (Signed)
CSN: [id number]     Arrival date & time 03/15/16  1312 History   First MD Initiated Contact with Patient 03/15/16 1506     Chief Complaint  Patient presents with  . Wrist Pain  . Bloated     (Consider location/radiation/quality/duration/timing/severity/associated sxs/prior Treatment) HPI Comments: [name] is a 45 y.o. female with a PMHx of HTN and lactose intolerance, with a PSHx of cholecystectomy, who presents to the ED with multiple complaints. Her primary complaint is left wrist pain and right foot pain 1 week. Patient states that one year ago she dropped a drawer on her right foot, pain subsided shortly thereafter and she did not seek medical attention, but one week ago she began having pain in the same location where the drawer hit her foot. When she went to get up one morning, the pain kept her from bearing weight in the right foot and therefore she fell onto her left wrist. She describes the pain in her left wrist as 8/10 intermittent throbbing nonradiating pain, worse with taking it down with gravity, and somewhat relieved with Aleve and Motrin. Associated symptoms include intermittent swelling of the left wrist and right foot. She denies that she injured the right foot during the fall, denies any ankle injury during the fall. She denies any bruising, erythema or warmth, or any other symptoms associated.  Secondly she states that one week ago she was eating tostitos and noticed a swollen area on her right upper quadrant abdominal area. States that it occurs intermittently and usually after eating, no specific foods seem to trigger it but it seems to occur after every meal. She denies any pain in this location. No overlying skin changes to this location.  She denies any fevers, chills, chest pain, shortness breath, abdominal pain, N/V/D/C, obstipation, melena, hematochezia, dysuria, hematuria, vaginal bleeding or discharge, numbness, tingling, or focal weakness. She denies any recent  travel, sick contacts, suspicious food intake, alcohol use, or chronic NSAID use. LMP was 03/06/16.  Patient is a 45 y.o. female presenting with wrist pain. The history is provided by the patient and medical records. No language interpreter was used.  Wrist Pain This is a new problem. The current episode started in the past 7 days. The problem occurs constantly. The problem has been unchanged. Associated symptoms include arthralgias (R foot, L wrist) and joint swelling (R foot, L wrist). Pertinent negatives include no abdominal pain, chest pain, chills, fever, myalgias, nausea, numbness, urinary symptoms, vomiting or weakness. Exacerbated by: hanging wrist down with gravity. She has tried NSAIDs for the symptoms. The treatment provided moderate relief.    Past Medical History  Diagnosis Date  . Hypertension    Past Surgical History  Procedure Laterality Date  . Cholecystectomy    . Cyst removed     Family History  Problem Relation Age of Onset  . Hypertension Other   . Cancer Other   . Diabetes Other    Social History  Substance Use Topics  . Smoking status: Never Smoker   . Smokeless tobacco: None  . Alcohol Use: Yes     Comment: rare   OB History    No data available     Review of Systems  Constitutional: Negative for fever and chills.  Respiratory: Negative for shortness of breath.   Cardiovascular: Negative for chest pain.  Gastrointestinal: Negative for nausea, vomiting, abdominal pain, diarrhea, constipation and blood in stool.       +Swollen area in RUQ/R lateral abdomen after eating  Genitourinary: Negative for dysuria, hematuria, vaginal bleeding and vaginal discharge.  Musculoskeletal: Positive for joint swelling (R foot, L wrist) and arthralgias (R foot, L wrist). Negative for myalgias.  Skin: Negative for color change and wound.  Allergic/Immunologic: Negative for immunocompromised state.  Neurological: Negative for weakness and numbness.    Psychiatric/Behavioral: Negative for confusion.   10 Systems reviewed and are negative for acute change except as noted in the HPI.    Allergies  Lactose intolerance (gi)  Home Medications   Prior to Admission medications   Medication Sig Start Date End Date Taking? Authorizing Provider  fluconazole (DIFLUCAN) 150 MG tablet Take 1 tablet (150 mg total) by mouth once. Patient not taking: Reported on 01/18/2015 05/24/14   Alvina Chou, PA-C  HYDROcodone-homatropine [facility]) 5-1.5 MG/5ML syrup Take 5 mLs by mouth every 6 (six) hours as needed for cough. Patient not taking: Reported on 01/18/2015 05/24/14   Alvina Chou, PA-C  levofloxacin (LEVAQUIN) 500 MG tablet Take 1 tablet (500 mg total) by mouth daily. Patient not taking: Reported on 01/18/2015 05/24/14   Alvina Chou, PA-C   BP 134/92 mmHg  Pulse 89  Temp(Src) 98.7 F (37.1 C) (Oral)  Resp 18  Ht 5' (1.524 m)  Wt 104.327 kg  BMI 44.92 kg/m2  SpO2 98%  LMP 03/06/2016 Physical Exam  Constitutional: She is oriented to person, place, and time. Vital signs are normal. She appears well-developed and well-nourished.  Non-toxic appearance. No distress.  Afebrile, nontoxic, NAD  HENT:  Head: Normocephalic and atraumatic.  Mouth/Throat: Oropharynx is clear and moist and mucous membranes are normal.  Eyes: Conjunctivae and EOM are normal. Right eye exhibits no discharge. Left eye exhibits no discharge.  Neck: Normal range of motion. Neck supple.  Cardiovascular: Normal rate, regular rhythm, normal heart sounds and intact distal pulses.  Exam reveals no gallop and no friction rub.   No murmur heard. Pulmonary/Chest: Effort normal and breath sounds normal. No respiratory distress. She has no decreased breath sounds. She has no wheezes. She has no rhonchi. She has no rales.  Abdominal: Soft. Normal appearance and bowel sounds are normal. She exhibits no distension. There is no tenderness. There is no rigidity, no rebound, no  guarding, no CVA tenderness, no tenderness at McBurney's point and negative Murphy's sign. No hernia.    Obese which limits exam slightly, Soft, NTND, +BS throughout, no r/g/r, neg murphy's, neg mcburney's, no CVA TTP  ?Lipoma to RUQ subcutaneous fat region, nonTTP without overlying skin changes, doesn't change with valsalva, no hernias palpated to abdomen  Musculoskeletal: Normal range of motion.       Left wrist: She exhibits tenderness and bony tenderness. She exhibits normal range of motion, no swelling, no effusion, no crepitus, no deformity and no laceration.       Arms:      Right foot: There is tenderness and bony tenderness. There is normal range of motion, no swelling, normal capillary refill, no crepitus, no deformity and no laceration.       Feet:  R foot with minimal TTP over the dorsum of the metatarsal region and into tarsal region, FROM intact in all digits, without bruising or swelling, skin intact, no erythema or warmth, with no crepitus or deformities. L wrist with FROM intact, with mild TTP to the volar aspect of the wrist, neg tinel's sign, with no swelling or bruising, no erythema or warmth, no crepitus or deformities Strength and sensation grossly intact in all extremities Distal pulses intact, cap refill brisk  and present Soft compartments in all extremities  Neurological: She is alert and oriented to person, place, and time. She has normal strength. No sensory deficit.  Skin: Skin is warm, dry and intact. No bruising and no rash noted. No erythema.  Psychiatric: She has a normal mood and affect.  Nursing note and vitals reviewed.   ED Course  Procedures (including critical care time) Labs Review Labs Reviewed - No data to display  Imaging Review Dg Wrist Complete Left  03/15/2016  CLINICAL DATA:  [place] last Monday getting out of bed, hit LEFT wrist on window, swelling and anterior pain EXAM: LEFT WRIST - COMPLETE 3+ VIEW COMPARISON:  None FINDINGS: Osseous  mineralization normal. Joint spaces preserved. No fracture, dislocation, or bone destruction. IMPRESSION: Normal exam. Electronically Signed   By: Lavonia Dana M.D.   On: 03/15/2016 14:24   Dg Foot Complete Right  03/15/2016  CLINICAL DATA:  Proximal RIGHT foot pain.  Blunt trauma EXAM: RIGHT FOOT COMPLETE - 3+ VIEW COMPARISON:  None. FINDINGS: No fracture or dislocation of mid foot or forefoot. The phalanges are normal. The calcaneus is normal. No soft tissue abnormality. IMPRESSION: No fracture or dislocation.) Electronically Signed   By: Suzy Bouchard M.D.   On: 03/15/2016 16:11   I have personally reviewed and evaluated these images and lab results as part of my medical decision-making.   EKG Interpretation None      MDM   Final diagnoses:  Left wrist sprain, initial encounter  Right foot pain  Lipoma of abdominal wall  HTN (hypertension), benign    45 y.o. female here with R foot pain, L wrist injury, and "swelling" in the RUQ after eating. On exam, L wrist with FROM intact, mild tenderness to wrist, R foot with mild TTP to anterior foot over the base of the metatarsals, no swelling in either area, NVI with soft compartments in all extremities/joints, no bruising or erythema/warmth. On abdominal exam, no focal tenderness, small area that seems to be a lipoma noted to R lateral abdomen, does not change with valsalva, doesn't appear to be hernia. Doubt need for abdominal imaging or lab work, discussed f/up with PCP and ongoing monitoring, if it continues to cause discomfort she can see a surgeon to discuss possible removal of lipoma. L wrist xray neg, likely sprain, will splint wrist. Will obtain xray of R foot since she states this pain has been causing difficulty walking, and had injury last year to it. Will reassess after imaging. Took ibuprofen earlier which helped. Will reassess shortly  4:14 PM  Xray R foot neg. Will apply ASO brace and give crutches for comfort. Velcro wrist  splint given. Discussed RICE and tylenol/motrin, f/up with ortho in 1-2wks for ongoing pain in R foot or L wrist. For abdominal wall ?lipoma, discussed f/up with PCP in 1-2wks for ongoing eval/management of this symptom. I explained the diagnosis and have given explicit precautions to return to the ER including for any other new or worsening symptoms. The patient understands and accepts the medical plan as it's been dictated and I have answered their questions. Discharge instructions concerning home care and prescriptions have been given. The patient is STABLE and is discharged to home in good condition.   BP 129/91 mmHg  Pulse 86  Temp(Src) 98.7 F (37.1 C) (Oral)  Resp 18  Ht 5' (1.524 m)  Wt 104.327 kg  BMI 44.92 kg/m2  SpO2 100%  LMP 03/06/2016    Vanessa Gorr Camprubi-Soms, PA-C 03/15/16 1618  Leonard Schwartz, MD 03/19/16 [phone]

## 2016-03-15 NOTE — ED Notes (Signed)
Patient here with left wrist injury after catching herself from falling 1 week ago. No deformity, no swelling. Also complains of swelling to right abdomen, states she can feel it bulge when she eats, no pain, no other associated symptoms

## 2016-03-15 NOTE — Progress Notes (Signed)
Orthopedic Tech Progress Note Patient Details:  Vanessa Benitez 1971/05/03 [id number]  Ortho Devices Type of Ortho Device: Crutches, Velcro wrist splint, ASO Ortho Device/Splint Location: RLE aso ankle, LUE wrist Ortho Device/Splint Interventions: Ordered, Application   Braulio Bosch 03/15/2016, 5:13 PM

## 2016-03-15 NOTE — Discharge Instructions (Signed)
Wear wrist and ankle brace for at least 1 weeks for stabilization of wrist, then as needed thereafter for comfort. Ice and elevate wrist and foot throughout the day. Use crutches as needed for comfort. Alternate between ibuprofen and tylenol for pain relief. Call orthopedic follow up today or tomorrow to schedule followup appointment for recheck of ongoing wrist and foot pain in 1-2 weeks that can be canceled with a 24-48 hour notice if complete resolution of pain. 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 Return to the ER for changes or worsening symptoms.   Wrist Sprain A wrist sprain is a stretch or tear in the strong, fibrous tissues (ligaments) that connect your wrist bones. The ligaments of your wrist may be easily sprained. There are three types of wrist sprains.  Grade 1. The ligament is not stretched or torn, but the sprain causes pain.  Grade 2. The ligament is stretched or partially torn. You may be able to move your wrist, but not very much.  Grade 3. The ligament or muscle completely tears. You may find it difficult or extremely painful to move your wrist even a little. CAUSES Often, wrist sprains are a result of a fall or an injury. The force of the impact causes the fibers of your ligament to stretch too much or tear. Common causes of wrist sprains include:  Overextending your wrist while catching a ball with your hands.  Repetitive or strenuous extension or bending of your wrist.  Landing on your hand during a fall. RISK FACTORS  Having previous wrist injuries.  Playing contact sports, such as boxing or wrestling.  Participating in activities in which falling is common.  Having poor wrist strength and flexibility. SIGNS AND SYMPTOMS  Wrist pain.  Wrist tenderness.  Inflammation or bruising of the wrist area.  Hearing a "pop" or feeling a tear at the time of the  injury.  Decreased wrist movement due to pain, stiffness, or weakness. DIAGNOSIS Your health care provider will examine your wrist. In some cases, an X-ray will be taken to make sure you did not break any bones. If your health care provider thinks that you tore a ligament, he or she may order an MRI of your wrist. TREATMENT Treatment involves resting and icing your wrist. You may also need to take pain medicines to help lessen pain and inflammation. Your health care provider may recommend keeping your wrist still (immobilized) with a splint to help your sprain heal. When the splint is no longer necessary, you may need to perform strengthening and stretching exercises. These exercises help you to regain strength and full range of motion in your wrist. Surgery is not usually needed for wrist sprains unless the ligament completely tears. HOME CARE INSTRUCTIONS  Rest your wrist. Do not do things that cause pain.  Wear your wrist splint as directed by your health care provider.  Take medicines only as directed by your health care provider.  To ease pain and swelling, apply ice to the injured area.  Put ice in a plastic bag.  Place a towel between your skin and the bag.  Leave the ice on for 20 minutes, 2-3 times a day. SEEK MEDICAL CARE IF:  Your pain, discomfort, or swelling gets worse even with treatment.  You feel sudden numbness in your hand.   This information is not intended to replace advice given to you by your health care provider. Make  sure you discuss any questions you have with your health care provider.   Document Released: 05/11/2014 Document Reviewed: 05/11/2014 Elsevier Interactive Patient Education 2016 [place] Contusion  A foot contusion is a deep bruise to the foot. Contusions happen when an injury causes bleeding under the skin. Signs of bruising include pain, puffiness (swelling), and discolored skin. The contusion may turn blue, purple, or  yellow. HOME CARE  Put ice on the injured area.  Put ice in a plastic bag.  Place a towel between your skin and the bag.  Leave the ice on for 15-20 minutes, 03-04 times a day.  Only take medicines as told by your doctor.  Use an elastic wrap only as told. You may remove the wrap for sleeping, showering, and bathing. Take the wrap off if you lose feeling (numb) in your toes, or they turn blue or cold. Put the wrap on more loosely.  Keep the foot raised (elevated) with pillows.  If your foot hurts, avoid standing or walking.  When your doctor says it is okay to use your foot, start using it slowly. If you have pain, lessen how much you use your foot.  See your doctor as told. GET HELP RIGHT AWAY IF:   You have more redness, puffiness, or pain in your foot.  Your puffiness or pain does not get better with medicine.  You lose feeling in your foot, or you cannot move your toes.  Your foot turns cold or blue.  You have pain when you move your toes.  Your foot feels warm.  Your contusion does not get better in 2 days. MAKE SURE YOU:   Understand these instructions.  Will watch this condition.  Will get help right away if you or your child is not doing well or gets worse.   This information is not intended to replace advice given to you by your health care provider. Make sure you discuss any questions you have with your health care provider.   Document Released: 06/16/2008 Document Revised: 03/08/2012 Document Reviewed: 05/14/2015 Elsevier Interactive Patient Education 2016 [employer] Musculoskeletal Pain Musculoskeletal pain is muscle and boney aches and pains. These pains can occur in any part of the body. Your caregiver may treat you without knowing the cause of the pain. They may treat you if blood or urine tests, X-rays, and other tests were normal.  CAUSES There is often not a definite cause or reason for these pains. These pains may be caused by a type of germ  (virus). The discomfort may also come from overuse. Overuse includes working out too hard when your body is not fit. Boney aches also come from weather changes. Bone is sensitive to atmospheric pressure changes. HOME CARE INSTRUCTIONS   Ask when your test results will be ready. Make sure you get your test results.  Only take over-the-counter or prescription medicines for pain, discomfort, or fever as directed by your caregiver. If you were given medications for your condition, do not drive, operate machinery or power tools, or sign legal documents for 24 hours. Do not drink alcohol. Do not take sleeping pills or other medications that may interfere with treatment.  Continue all activities unless the activities cause more pain. When the pain lessens, slowly resume normal activities. Gradually increase the intensity and duration of the activities or exercise.  During periods of severe pain, bed rest may be helpful. Lay or sit in any position that is comfortable.  Putting ice on the  injured area.  Put ice in a bag.  Place a towel between your skin and the bag.  Leave the ice on for 15 to 20 minutes, 3 to 4 times a day.  Follow up with your caregiver for continued problems and no reason can be found for the pain. If the pain becomes worse or does not go away, it may be necessary to repeat tests or do additional testing. Your caregiver may need to look further for a possible cause. SEEK IMMEDIATE MEDICAL CARE IF:  You have pain that is getting worse and is not relieved by medications.  You develop chest pain that is associated with shortness or breath, sweating, feeling sick to your stomach (nauseous), or throw up (vomit).  Your pain becomes localized to the abdomen.  You develop any new symptoms that seem different or that concern you. MAKE SURE YOU:   Understand these instructions.  Will watch your condition.  Will get help right away if you are not doing well or get worse.   This  information is not intended to replace advice given to you by your health care provider. Make sure you discuss any questions you have with your health care provider.   Document Released: 09/07/2005 Document Revised: 11/30/2011 Document Reviewed: 05/12/2013 Elsevier Interactive Patient Education 2016 Else[employer] Inc[employer]  Lipoma A lipoma is a noncancerous (benign) tumor that is made up of fat cells. This is a very common type of soft-tissue growth. Lipomas are usually found under the skin (subcutaneous). They may occur in any tissue of the body that contains fat. Common areas for lipomas to appear include the back, shoulders, buttocks, and thighs. Lipomas grow slowly, and they are usually painless. Most lipomas do not cause problems and do not require treatment. CAUSES The cause of this condition is not known. RISK FACTORS This condition is more likely to develop in:  People who are 56-37 years old.  People who have a family history of lipomas. SYMPTOMS A lipoma usually appears as a small, round bump under the skin. It may feel soft or rubbery, but the firmness can vary. Most lipomas are not painful. However, a lipoma may become painful if it is located in an area where it pushes on nerves. DIAGNOSIS A lipoma can usually be diagnosed with a physical exam. You may also have tests to confirm the diagnosis and to rule out other conditions. Tests may include:  Imaging tests, such as a CT scan or MRI.  Removal of a tissue sample to be looked at under a microscope (biopsy). TREATMENT Treatment is not needed for small lipomas that are not causing problems. If a lipoma continues to get bigger or it causes problems, removal is often the best option. Lipomas can also be removed to improve appearance. Removal of a lipoma is usually done with a surgery in which the fatty cells and the surrounding capsule are removed. Most often, a medicine that numbs the area (local anesthetic) is used for this procedure. HOME  CARE INSTRUCTIONS  Keep all follow-up visits as directed by your health care provider. This is important. SEEK MEDICAL CARE IF:  Your lipoma becomes larger or hard.  Your lipoma becomes painful, red, or increasingly swollen. These could be signs of infection or a more serious condition.   This information is not intended to replace advice given to you by your health care provider. Make sure you discuss any questions you have with your health care provider.   Document Released: 08/28/2002 Document Revised: 01/22/2015  Document Reviewed: 09/03/2014 Elsevier Interactive Patient Education [employer].  Cryotherapy Cryotherapy is when you put ice on your injury. Ice helps lessen pain and puffiness (swelling) after an injury. Ice works the best when you start using it in the first 24 to 48 hours after an injury. HOME CARE  Put a dry or damp towel between the ice pack and your skin.  You may press gently on the ice pack.  Leave the ice on for no more than 10 to 20 minutes at a time.  Check your skin after 5 minutes to make sure your skin is okay.  Rest at least 20 minutes between ice pack uses.  Stop using ice when your skin loses feeling (numbness).  Do not use ice on someone who cannot tell you when it hurts. This includes small children and people with memory problems (dementia). GET HELP RIGHT AWAY IF:  You have white spots on your skin.  Your skin turns blue or pale.  Your skin feels waxy or hard.  Your puffiness gets worse. MAKE SURE YOU:   Understand these instructions.  Will watch your condition.  Will get help right away if you are not doing well or get worse.   This information is not intended to replace advice given to you by your health care provider. Make sure you discuss any questions you have with your health care provider.   Document Released: 02/24/2008 Document Revised: 11/30/2011 Document Reviewed: 04/30/2011 Elsevier Interactive Patient Education  [employer].

## 2016-11-11 ENCOUNTER — Encounter (HOSPITAL_COMMUNITY): Payer: Self-pay | Admitting: Emergency Medicine

## 2016-11-11 ENCOUNTER — Emergency Department (HOSPITAL_COMMUNITY)
Admission: EM | Admit: 2016-11-11 | Discharge: 2016-11-11 | Disposition: A | Discharge status: Home / Self Care | Payer: Medicaid Other | Attending: Dermatology | Admitting: Dermatology

## 2016-11-11 DIAGNOSIS — I10 Essential (primary) hypertension: Secondary | ICD-10-CM | POA: Insufficient documentation

## 2016-11-11 DIAGNOSIS — Z5321 Procedure and treatment not carried out due to patient leaving prior to being seen by health care provider: Secondary | ICD-10-CM | POA: Insufficient documentation

## 2016-11-11 NOTE — ED Triage Notes (Signed)
Pt came d/t headache and elevated BP denies N/V or blurred vision

## 2016-11-11 NOTE — ED Notes (Signed)
Pt called to be roomed with no response.  RN notified. 

## 2016-11-11 NOTE — ED Notes (Signed)
Pt called x 2 with no answer  

## 2016-11-23 ENCOUNTER — Encounter (HOSPITAL_COMMUNITY): Payer: Self-pay | Admitting: Emergency Medicine

## 2016-11-23 ENCOUNTER — Emergency Department (HOSPITAL_COMMUNITY)
Admission: EM | Admit: 2016-11-23 | Discharge: 2016-11-23 | Disposition: A | Discharge status: Home / Self Care | Payer: Medicaid Other | Attending: Emergency Medicine | Admitting: Emergency Medicine

## 2016-11-23 DIAGNOSIS — Z79899 Other long term (current) drug therapy: Secondary | ICD-10-CM | POA: Diagnosis not present

## 2016-11-23 DIAGNOSIS — I10 Essential (primary) hypertension: Secondary | ICD-10-CM

## 2016-11-23 DIAGNOSIS — R6889 Other general symptoms and signs: Secondary | ICD-10-CM

## 2016-11-23 DIAGNOSIS — R5383 Other fatigue: Secondary | ICD-10-CM | POA: Diagnosis present

## 2016-11-23 LAB — CBC WITH DIFFERENTIAL/PLATELET
Basophils Absolute: 0 10*3/uL (ref 0.0–0.1)
Basophils Relative: 0 %
EOS PCT: 1 %
Eosinophils Absolute: 0.1 10*3/uL (ref 0.0–0.7)
HCT: 35.7 % — ABNORMAL LOW (ref 36.0–46.0)
Hemoglobin: 12.2 g/dL (ref 12.0–15.0)
LYMPHS PCT: 25 %
Lymphs Abs: 1.5 10*3/uL (ref 0.7–4.0)
MCH: 30.7 pg (ref 26.0–34.0)
MCHC: 34.2 g/dL (ref 30.0–36.0)
MCV: 89.7 fL (ref 78.0–100.0)
MONO ABS: 0.4 10*3/uL (ref 0.1–1.0)
Monocytes Relative: 7 %
Neutro Abs: 4.1 10*3/uL (ref 1.7–7.7)
Neutrophils Relative %: 67 %
PLATELETS: 226 10*3/uL (ref 150–400)
RBC: 3.98 MIL/uL (ref 3.87–5.11)
RDW: 14.3 % (ref 11.5–15.5)
WBC: 6.1 10*3/uL (ref 4.0–10.5)

## 2016-11-23 LAB — URINALYSIS, ROUTINE W REFLEX MICROSCOPIC
BACTERIA UA: NONE SEEN
Bilirubin Urine: NEGATIVE
Glucose, UA: NEGATIVE mg/dL
Ketones, ur: NEGATIVE mg/dL
Leukocytes, UA: NEGATIVE
Nitrite: NEGATIVE
PROTEIN: NEGATIVE mg/dL
SPECIFIC GRAVITY, URINE: 1.017 (ref 1.005–1.030)
pH: 7 (ref 5.0–8.0)

## 2016-11-23 LAB — BASIC METABOLIC PANEL
ANION GAP: 6 (ref 5–15)
BUN: 14 mg/dL (ref 6–20)
CO2: 27 mmol/L (ref 22–32)
CREATININE: 0.96 mg/dL (ref 0.44–1.00)
Calcium: 8.8 mg/dL — ABNORMAL LOW (ref 8.9–10.3)
Chloride: 102 mmol/L (ref 101–111)
GFR calc non Af Amer: >60 mL/min (ref >60)
Glucose, Bld: 93 mg/dL (ref 65–99)
Potassium: 4.2 mmol/L (ref 3.5–5.1)
SODIUM: 135 mmol/L (ref 135–145)

## 2016-11-23 LAB — PREGNANCY, URINE: Preg Test, Ur: NEGATIVE

## 2016-11-23 LAB — CK: Total CK: 76 U/L (ref 38–234)

## 2016-11-23 MED ORDER — OSELTAMIVIR PHOSPHATE 75 MG PO CAPS: 75.0000 mg | ORAL_CAPSULE | Freq: Two times a day (BID) | ORAL | 0 refills | Status: DC

## 2016-11-23 MED ORDER — TRIAMTERENE-HCTZ 37.5-25 MG PO TABS: 1.0000 | ORAL_TABLET | Freq: Every day | ORAL | 0 refills | Status: DC

## 2016-11-23 NOTE — ED Triage Notes (Signed)
Patient reports headaches for week or so with right arm and hand heaviness and weaker than left side. Patient also c/o body aches since last night.

## 2016-11-23 NOTE — ED Provider Notes (Signed)
[facility] DEPT Provider Note   CSN: [id number] Arrival date & time: 11/23/16  1549     History   Chief Complaint Chief Complaint  Patient presents with  . Headache  . Extremity Weakness  . Generalized Body Aches    HPI Shandricka Manzione is a 46 y.o. female.  Patient is a 46 year old who presents with a variety of complaints. She states that her blood pressures been elevated for the last several weeks. She complains of some intermittent headaches although she currently denies a headache. She's been having some tingling and feeling of weakness in her right arm. She has some pain in her right arm that starts in her forearm and goes up to her shoulder. She denies any weakness or numbness and the other extremities. No speech deficits or vision changes. No problem with her balance. She states that she was on lisinopril/hydrochlorothiazide but stopped it about 6 months ago. She restarted it in December for a week or so but then stopped it. It was giving her a sore throat and feeling of throat swelling so she hasn't taking it any longer. She also has a one-day history of some flulike symptoms with myalgias and pain in her thighs. She states that it feels like she had a bad workout. She denies any known fevers. She has a little bit of a dry cough and a little bit of rhinorrhea. She was exposed to patients with the flu over the weekend. She denies any shortness of breath. No chest pain. No vomiting or diarrhea.      Past Medical History:  Diagnosis Date  . Hypertension     There are no active problems to display for this patient.   Past Surgical History:  Procedure Laterality Date  . CHOLECYSTECTOMY    . cyst removed      OB History    No data available       Home Medications    Prior to Admission medications   Medication Sig Start Date End Date Taking? Authorizing Provider  Cholecalciferol (VITAMIN D PO) Take 1 tablet by mouth daily.   Yes Historical Provider, MD  GARCINIA  CAMBOGIA-CHROMIUM PO Take 2 tablets by mouth daily.   Yes Historical Provider, MD  ibuprofen (ADVIL,MOTRIN) 200 MG tablet Take 400 mg by mouth every 6 (six) hours as needed.   Yes Historical Provider, MD  oseltamivir (TAMIFLU) 75 MG capsule Take 1 capsule (75 mg total) by mouth every 12 (twelve) hours. 11/23/16   Malvin Johns, MD  triamterene-hydrochlorothiazide (MAXZIDE-25) 37.5-25 MG tablet Take 1 tablet by mouth daily. 11/23/16   Malvin Johns, MD    Family History Family History  Problem Relation Age of Onset  . Hypertension Other   . Cancer Other   . Diabetes Other     Social History Social History  Substance Use Topics  . Smoking status: Never Smoker  . Smokeless tobacco: Never Used  . Alcohol use Yes     Comment: rare     Allergies   Lactose intolerance (gi); Lisinopril; and Penicillins   Review of Systems Review of Systems  Constitutional: Positive for fatigue. Negative for chills, diaphoresis and fever.  HENT: Positive for congestion and rhinorrhea. Negative for sneezing.   Eyes: Negative.   Respiratory: Positive for cough. Negative for chest tightness and shortness of breath.   Cardiovascular: Negative for chest pain and leg swelling.  Gastrointestinal: Negative for abdominal pain, blood in stool, diarrhea, nausea and vomiting.  Genitourinary: Negative for difficulty urinating, flank pain, frequency  and hematuria.  Musculoskeletal: Positive for myalgias. Negative for arthralgias and back pain.  Skin: Negative for rash.  Neurological: Positive for weakness and numbness. Negative for dizziness, speech difficulty and headaches.     Physical Exam Updated Vital Signs BP (!) 165/105 (BP Location: Left Arm)   Pulse 95   Temp 99.7 F (37.6 C) (Oral)   Resp 19   Ht 5' (1.524 m)   Wt 220 lb (99.8 kg)   LMP 11/17/2016   SpO2 100%   BMI 42.97 kg/m   Physical Exam  Constitutional: She is oriented to person, place, and time. She appears well-developed and  well-nourished.  HENT:  Head: Normocephalic and atraumatic.  Eyes: Pupils are equal, round, and reactive to light.  Neck: Normal range of motion. Neck supple.  Cardiovascular: Normal rate, regular rhythm and normal heart sounds.   Pulmonary/Chest: Effort normal and breath sounds normal. No respiratory distress. She has no wheezes. She has no rales. She exhibits no tenderness.  Abdominal: Soft. Bowel sounds are normal. There is no tenderness. There is no rebound and no guarding.  Musculoskeletal: Normal range of motion. She exhibits no edema.  Patient has some tenderness to the right forearm and right shoulder. There is no swelling. No warmth or erythema. Radial pulses are intact. She has normal sensation and motor function in the arm.  Lymphadenopathy:    She has no cervical adenopathy.  Neurological: She is alert and oriented to person, place, and time.  Motor 5/5 all extremities Sensation grossly intact to LT all extremities Finger to Nose intact, no pronator drift CN II-XII grossly intact Gait normal   Skin: Skin is warm and dry. No rash noted.  Psychiatric: She has a normal mood and affect.     ED Treatments / Results  Labs (all labs ordered are listed, but only abnormal results are displayed) Labs Reviewed  BASIC METABOLIC PANEL - Abnormal; Notable for the following:       Result Value   Calcium 8.8 (*)    All other components within normal limits  CBC WITH DIFFERENTIAL/PLATELET - Abnormal; Notable for the following:    HCT 35.7 (*)    All other components within normal limits  URINALYSIS, ROUTINE W REFLEX MICROSCOPIC - Abnormal; Notable for the following:    Hgb urine dipstick SMALL (*)    Squamous Epithelial / LPF 0-5 (*)    All other components within normal limits  CK  PREGNANCY, URINE    EKG  EKG Interpretation  Date/Time:  Monday November 23 2016 20:02:44 EST Ventricular Rate:  103 PR Interval:    QRS Duration: 77 QT Interval:  393 QTC Calculation: 515 R  Axis:   29 Text Interpretation:  Sinus tachycardia Paired ventricular premature complexes Aberrant complex Probable left atrial enlargement Borderline T abnormalities, lateral leads Prolonged QT interval since last tracing no significant change Confirmed by Velton Roselle  MD, Zafirah Vanzee ([id number]) on 11/23/2016 10:00:14 PM       Radiology No results found.  Procedures Procedures (including critical care time)  Medications Ordered in ED Medications - No data to display   Initial Impression / Assessment and Plan / ED Course  I have reviewed the triage vital signs and the nursing notes.  Pertinent labs & imaging results that were available during my care of the patient were reviewed by me and considered in my medical decision making (see chart for details).     Patient presents with uncontrolled hypertension. She seemed to have questionable angioedema with her  lisinopril. I will switch her to triamterene/hctz.  She doesn't have any symptoms of acute coronary syndrome. She has some subjective numbness and weakness in the right arm with associated pain in her forearm and elbow. I suspect this is a neuropathy. She had normal neuro exam on my evaluation. There is no other symptoms that would be more concerning for stroke. She doesn't currently have a headache. She also has some mildly elevated heart rate of 9200. She has some flulike symptoms with her recent flu exposure. Her lungs are clear without suggestions of pneumonia. She has a temperature of 99.7. I discussed possibility of Tamiflu with her and she is opting to go ahead and get a prescription for Tamiflu. She was also given a prescription for the [facility]. She was instructed to follow-up with her PCP. Return precautions were given.  Final Clinical Impressions(s) / ED Diagnoses   Final diagnoses:  Flu-like symptoms  Essential hypertension    New Prescriptions New Prescriptions   OSELTAMIVIR (TAMIFLU) 75 MG CAPSULE    Take 1 capsule (75 mg total) by  mouth every 12 (twelve) hours.   TRIAMTERENE-HYDROCHLOROTHIAZIDE (MAXZIDE-25) 37.5-25 MG TABLET    Take 1 tablet by mouth daily.     Malvin Johns, MD 11/23/16 2202

## 2019-12-06 ENCOUNTER — Encounter (HOSPITAL_COMMUNITY): Payer: Self-pay | Admitting: Emergency Medicine

## 2019-12-06 ENCOUNTER — Other Ambulatory Visit: Payer: Self-pay

## 2019-12-06 ENCOUNTER — Emergency Department (HOSPITAL_COMMUNITY)
Admission: EM | Admit: 2019-12-06 | Discharge: 2019-12-06 | Disposition: A | Discharge status: Home / Self Care | Payer: BC Managed Care – PPO | Attending: Emergency Medicine | Admitting: Emergency Medicine

## 2019-12-06 DIAGNOSIS — M542 Cervicalgia: Secondary | ICD-10-CM | POA: Diagnosis not present

## 2019-12-06 DIAGNOSIS — Z5321 Procedure and treatment not carried out due to patient leaving prior to being seen by health care provider: Secondary | ICD-10-CM | POA: Diagnosis not present

## 2019-12-06 NOTE — ED Triage Notes (Signed)
Patient reports "knots" to posterior neck and head x4 days. Reports tenderness at these sites. Denies headache, N/V/D, chest pain, SOB.

## 2019-12-07 ENCOUNTER — Other Ambulatory Visit: Payer: Self-pay

## 2019-12-07 ENCOUNTER — Encounter (HOSPITAL_COMMUNITY): Payer: Self-pay

## 2019-12-07 ENCOUNTER — Emergency Department (HOSPITAL_COMMUNITY)
Admission: EM | Admit: 2019-12-07 | Discharge: 2019-12-07 | Disposition: A | Discharge status: Home / Self Care | Payer: BC Managed Care – PPO | Attending: Emergency Medicine | Admitting: Emergency Medicine

## 2019-12-07 ENCOUNTER — Emergency Department (HOSPITAL_COMMUNITY): Payer: BC Managed Care – PPO

## 2019-12-07 DIAGNOSIS — R591 Generalized enlarged lymph nodes: Secondary | ICD-10-CM | POA: Insufficient documentation

## 2019-12-07 DIAGNOSIS — Z79899 Other long term (current) drug therapy: Secondary | ICD-10-CM | POA: Diagnosis not present

## 2019-12-07 DIAGNOSIS — R220 Localized swelling, mass and lump, head: Secondary | ICD-10-CM | POA: Diagnosis present

## 2019-12-07 DIAGNOSIS — J039 Acute tonsillitis, unspecified: Secondary | ICD-10-CM | POA: Diagnosis not present

## 2019-12-07 DIAGNOSIS — R911 Solitary pulmonary nodule: Secondary | ICD-10-CM | POA: Diagnosis not present

## 2019-12-07 DIAGNOSIS — I10 Essential (primary) hypertension: Secondary | ICD-10-CM | POA: Insufficient documentation

## 2019-12-07 LAB — CBC WITH DIFFERENTIAL/PLATELET
Abs Immature Granulocytes: 0.02 10*3/uL (ref 0.00–0.07)
Basophils Absolute: 0 10*3/uL (ref 0.0–0.1)
Basophils Relative: 0 %
Eosinophils Absolute: 0.2 10*3/uL (ref 0.0–0.5)
Eosinophils Relative: 2 %
HCT: 34.8 % — ABNORMAL LOW (ref 36.0–46.0)
Hemoglobin: 11.3 g/dL — ABNORMAL LOW (ref 12.0–15.0)
Immature Granulocytes: 0 %
Lymphocytes Relative: 24 %
Lymphs Abs: 1.9 10*3/uL (ref 0.7–4.0)
MCH: 27.9 pg (ref 26.0–34.0)
MCHC: 32.5 g/dL (ref 30.0–36.0)
MCV: 85.9 fL (ref 80.0–100.0)
Monocytes Absolute: 0.4 10*3/uL (ref 0.1–1.0)
Monocytes Relative: 6 %
Neutro Abs: 5.3 10*3/uL (ref 1.7–7.7)
Neutrophils Relative %: 68 %
Platelets: 281 10*3/uL (ref 150–400)
RBC: 4.05 MIL/uL (ref 3.87–5.11)
RDW: 18.6 % — ABNORMAL HIGH (ref 11.5–15.5)
WBC: 7.8 10*3/uL (ref 4.0–10.5)
nRBC: 0 % (ref 0.0–0.2)

## 2019-12-07 LAB — BASIC METABOLIC PANEL
Anion gap: 9 (ref 5–15)
BUN: 6 mg/dL (ref 6–20)
CO2: 23 mmol/L (ref 22–32)
Calcium: 8.8 mg/dL — ABNORMAL LOW (ref 8.9–10.3)
Chloride: 105 mmol/L (ref 98–111)
Creatinine, Ser: 0.72 mg/dL (ref 0.44–1.00)
GFR calc Af Amer: >60 mL/min (ref >60)
GFR calc non Af Amer: >60 mL/min (ref >60)
Glucose, Bld: 97 mg/dL (ref 70–99)
Potassium: 3.5 mmol/L (ref 3.5–5.1)
Sodium: 137 mmol/L (ref 135–145)

## 2019-12-07 LAB — MONONUCLEOSIS SCREEN: Mono Screen: NEGATIVE

## 2019-12-07 MED ORDER — FLUCONAZOLE 200 MG PO TABS: 200.0000 mg | ORAL_TABLET | Freq: Every day | ORAL | 0 refills | Status: AC

## 2019-12-07 MED ORDER — IOHEXOL 300 MG/ML  SOLN
75.0000 mL | Freq: Once | INTRAMUSCULAR | Status: AC | PRN
  Administered 2019-12-07: 22:00:00 75 mL via INTRAVENOUS

## 2019-12-07 MED ORDER — AZITHROMYCIN 250 MG PO TABS: 250.0000 mg | ORAL_TABLET | Freq: Every day | ORAL | 0 refills | Status: DC

## 2019-12-07 NOTE — ED Notes (Signed)
Pt was discharged from the ED. Pt read and understood discharge paperwork. Pt had vital signs completed. Pt conscious, breathing, and A&Ox4. No distress noted. Pt speaking in complete sentences. Pt ambulated out of the ED with a smooth and steady gait. E-signature not available.  

## 2019-12-07 NOTE — ED Notes (Signed)
Pt to and from CT. No distress noted.

## 2019-12-07 NOTE — ED Provider Notes (Signed)
[facility] EMERGENCY DEPARTMENT Provider Note   CSN: [id number] Arrival date & time: 12/07/19  1836     History Chief Complaint  Patient presents with  . Neck Pain    Vanessa Benitez is a 49 y.o. female past medical history of hypertension who presents for evaluation of bumps noted to her head.  She states she first noticed this about 6 days ago.  She reports that she woke up and she had some small bumps noted to her head.  She states that shortly after, she started experiencing some small bumps noted to the posterior aspect of her neck as well as the anterior aspect of her neck.  She states that since then, these have grown more painful and larger.  She states that she was wearing a wig prior to onset of symptoms but she has worn that wig before without any abnormalities.  Patient reports that she went to [facility] yesterday for evaluation of symptoms but left before being seen.  She comes back in today because she is still been having pain with them.  She states that it is worse on the left side of her neck and she feels like it makes her whole left side of her neck ache.  She has been able to tolerate her secretions without any difficulty and has not any difficulty swallowing p.o. She iss not any difficulty breathing or vomiting. She reports fatigue but otherwise no symptoms. Patient states she has not had any fever, recent weight loss, night sweats.  Denies any new medications, vaccines.  The history is provided by the patient.       Past Medical History:  Diagnosis Date  . Hypertension     There are no problems to display for this patient.   Past Surgical History:  Procedure Laterality Date  . CHOLECYSTECTOMY    . cyst removed       OB History   No obstetric history on file.     Family History  Problem Relation Age of Onset  . Hypertension Other   . Cancer Other   . Diabetes Other     Social History   Tobacco Use  . Smoking status: Never  Smoker  . Smokeless tobacco: Never Used  Substance Use Topics  . Alcohol use: Yes    Comment: rare  . Drug use: No    Home Medications Prior to Admission medications   Medication Sig Start Date End Date Taking? Authorizing Provider  acetaminophen (TYLENOL) 500 MG tablet Take 500 mg by mouth every 6 (six) hours as needed for moderate pain or fever.   Yes [provider]  Cholecalciferol (VITAMIN D PO) Take 1 tablet by mouth daily.   Yes [provider]  ELDERBERRY PO Take 10 mLs by mouth daily as needed (For immune support).   Yes [provider]  ibuprofen (ADVIL,MOTRIN) 200 MG tablet Take 400 mg by mouth every 6 (six) hours as needed for moderate pain.    Yes [provider]  azithromycin (ZITHROMAX) 250 MG tablet Take 1 tablet (250 mg total) by mouth daily. Take first 2 tablets together, then 1 every day until finished. 12/07/19   Volanda Napoleon, PA-C  fluconazole (DIFLUCAN) 200 MG tablet Take 1 tablet (200 mg total) by mouth daily for 1 day. 12/07/19 12/08/19  Volanda Napoleon, PA-C  oseltamivir (TAMIFLU) 75 MG capsule Take 1 capsule (75 mg total) by mouth every 12 (twelve) hours. Patient not taking: Reported on 12/07/2019 11/23/16  Malvin Johns, MD  triamterene-hydrochlorothiazide (MAXZIDE-25) 37.5-25 MG tablet Take 1 tablet by mouth daily. Patient not taking: Reported on 12/07/2019 11/23/16   Malvin Johns, MD    Allergies    Lactose intolerance (gi), Lisinopril, and Penicillins  Review of Systems   Review of Systems  Constitutional: Negative for diaphoresis, fever and unexpected weight change.  Respiratory: Negative for shortness of breath.   Musculoskeletal: Positive for neck pain.  Hematological: Positive for adenopathy.  All other systems reviewed and are negative.   Physical Exam Updated Vital Signs BP (!) 152/88   Pulse 96   Temp 100 F (37.8 C)   Resp 18   LMP 11/15/2019   SpO2 98%   Physical Exam Vitals and nursing note  reviewed.  Constitutional:      Appearance: She is well-developed.  HENT:     Head: Normocephalic and atraumatic.     Mouth/Throat:     Comments: Posterior oropharynx is erythematous.  No edema, exudates.  Uvula is midline.  Airways patent, phonation is intact. Eyes:     General: No scleral icterus.       Right eye: No discharge.        Left eye: No discharge.     Conjunctiva/sclera: Conjunctivae normal.  Neck:     Comments: No evidence of edema.  She has bilateral superficial cervical lymphadenopathy as well as some bilateral posterior cervical lymphadenopathy.  Lymph nodes are slightly tender in appearance. Pulmonary:     Effort: Pulmonary effort is normal.  Lymphadenopathy:     Head:     Left side of head: Posterior auricular and occipital adenopathy present.     Cervical: Cervical adenopathy present.     Right cervical: Superficial cervical adenopathy and posterior cervical adenopathy present.     Left cervical: Superficial cervical adenopathy and posterior cervical adenopathy present.     Comments: Small amount of occipital and posterior auricular lymphadenopathy noted left side.  Skin:    General: Skin is warm and dry.  Neurological:     Mental Status: She is alert.  Psychiatric:        Speech: Speech normal.        Behavior: Behavior normal.     ED Results / Procedures / Treatments   Labs (all labs ordered are listed, but only abnormal results are displayed) Labs Reviewed  BASIC METABOLIC PANEL - Abnormal; Notable for the following components:      Result Value   Calcium 8.8 (*)    All other components within normal limits  CBC WITH DIFFERENTIAL/PLATELET - Abnormal; Notable for the following components:   Hemoglobin 11.3 (*)    HCT 34.8 (*)    RDW 18.6 (*)    All other components within normal limits  MONONUCLEOSIS SCREEN    EKG None  Radiology CT Soft Tissue Neck W Contrast  Result Date: 12/07/2019 CLINICAL DATA:  Cervical lymphadenopathy EXAM: CT NECK  WITH CONTRAST TECHNIQUE: Multidetector CT imaging of the neck was performed using the standard protocol following the bolus administration of intravenous contrast. CONTRAST:  12mL OMNIPAQUE IOHEXOL 300 MG/ML  SOLN COMPARISON:  None. FINDINGS: PHARYNX AND LARYNX: The adenoid tonsils are enlarged. No peritonsillar or retropharyngeal fluid collection. Normal oral cavity. Normal larynx and epiglottis. SALIVARY GLANDS: There are multiple bilateral intraparotid lymph nodes measuring up to 7 mm. THYROID: Normal. LYMPH NODES: There are numerous bilateral subcentimeter lymph nodes. These are predominantly at levels 5A and 5 B. Left level 5 a lymph node measures 9 mm (image  35). Posterior triangle lymph node on the left measures 7 mm (image 25). VASCULAR: Major cervical vessels are patent. LIMITED INTRACRANIAL: Normal. VISUALIZED ORBITS: Normal. MASTOIDS AND VISUALIZED PARANASAL SINUSES: No fluid levels or advanced mucosal thickening. No mastoid effusion. SKELETON: No bony spinal canal stenosis. No lytic or blastic lesions. UPPER CHEST: Incompletely visualized 4 mm nodule in the right upper lobe. OTHER: None. IMPRESSION: 1. Enlarged adenoid tonsils, which may indicate acute tonsillopharyngitis. No peritonsillar or retropharyngeal fluid collection. 2. Multiple bilateral subcentimeter cervical lymph nodes, which are nonspecific, but may be reactive if there are symptoms of infection. A lymphoproliferative process is also possible, but not as likely. 3. Incompletely visualized 4 mm nodule in the right upper lobe. Follow-up chest CT recommended in 3-6 months for complete visualization and to establish follow-up. Electronically Signed   By: Ulyses Jarred M.D.   On: 12/07/2019 22:06    Procedures Procedures (including critical care time)  Medications Ordered in ED Medications  iohexol (OMNIPAQUE) 300 MG/ML solution 75 mL (75 mLs Intravenous Contrast Given 12/07/19 2144)    ED Course  I have reviewed the triage vital  signs and the nursing notes.  Pertinent labs & imaging results that were available during my care of the patient were reviewed by me and considered in my medical decision making (see chart for details).    MDM Rules/Calculators/A&P                      49 year old female who presents for evaluation of neck pain, lymphadenopathy it has been ongoing for last 5 days.  No fever, weight loss, night sweats.  Reports pain to both anterior and posterior aspect of her neck.  On initial ED arrival, she is afebrile, nontoxic-appearing.  Vital signs are stable.  She does have evidence of both posterior and cervical lymphadenopathy noted to bilateral sides.  Posterior oropharynx is slightly erythematous but no evidence of exudates,.  Uvula is midline.  History/physical exam is not concerning for Ludwig angina, peritonsillar abscess.  Question if this is reactive lymphadenopathy from some process.  Given that she has a diffusely and is having pain, this may be likely from a viral source.  Given the extensiveness of it, will plan for CT soft tissue neck to ensure that there is no acute abnormality that is causing symptoms.  BMP is unremarkable.  CBC shows no leukocytosis.  Hemoglobin stable at 11.3.  Mono was negative.  CT soft tissue neck shows enlarged adenoid tonsils which may indicate tonsillopharyngitis.  No evidence of abscess.  She has multiple bilateral subcentimeter cervical lymph nodes which are nonspecific.  May be reactive.  She has an incompletely visualized 4 mm nodule in the right upper lobe.  Follow-up CT recommended.  I discussed results with patient.  Instructed her to follow-up with the ear nose and throat.  We will plan to send her home with antibiotics for possible tonsillitis/pharyngitis.  I discussed with patient that she does have improvement in symptoms, she will need to follow-up with ear nose and throat to ensure that this gets better.  Additionally, instructed patient to follow-up with her  primary care doctor regarding pulmonary nodule seen on today's findings. At this time, patient exhibits no emergent life-threatening condition that require further evaluation in ED or admission. Patient had ample opportunity for questions and discussion. All patient's questions were answered with full understanding. Strict return precautions discussed. Patient expresses understanding and agreement to plan.   Portions of this note were generated with  Lobby[employer]. Dictation errors may occur despite best attempts at proofreading.    Final Clinical Impression(s) / ED Diagnoses Final diagnoses:  Tonsillitis  Lymphadenopathy  Pulmonary nodule    Rx / DC Orders ED Discharge Orders         Ordered    azithromycin (ZITHROMAX) 250 MG tablet  Daily     12/07/19 2217    fluconazole (DIFLUCAN) 200 MG tablet  Daily     12/07/19 2223           Desma Mcgregor 12/07/19 2303    Quintella Reichert, MD 12/08/19 1836

## 2019-12-07 NOTE — ED Triage Notes (Signed)
Pt arrives to ED w/ c/o neck pain 8/10. Pt states on Friday she developed knots on posterior neck.

## 2019-12-07 NOTE — Discharge Instructions (Addendum)
Take antibiotics as directed. Please take all of your antibiotics until finished.  You can take Tylenol or Ibuprofen as directed for pain. You can alternate Tylenol and Ibuprofen every 4 hours. If you take Tylenol at 1pm, then you can take Ibuprofen at 5pm. Then you can take Tylenol again at 9pm.   As we discussed, your imaging looked reassuring.  They suspect this most likely may be reactive lymphadenopathy secondary to tonsillitis.  There is no signs of abscess.  Additionally, as we mention you have a nodule in the left ear.  We will have you follow-up with ear nose and throat to make sure that this is getting better and that is not progressing or getting worse.  Please call their office and arrange for an appointment.  Your CT scan did show evidence of a small pulmonary nodule in the right upper lung.  This is most likely benign.  It needs to be followed up by her primary care doctor.  Return the emergency department for any worsening pain, difficulty swallowing, difficulty breathing, vomiting, fever or any other worsening or concerning symptoms.

## 2021-03-20 ENCOUNTER — Emergency Department (HOSPITAL_COMMUNITY): Payer: BC Managed Care – PPO

## 2021-03-20 ENCOUNTER — Encounter (HOSPITAL_COMMUNITY): Payer: Self-pay

## 2021-03-20 ENCOUNTER — Emergency Department (HOSPITAL_COMMUNITY)
Admission: EM | Admit: 2021-03-20 | Discharge: 2021-03-20 | Disposition: A | Discharge status: Home / Self Care | Payer: BC Managed Care – PPO | Attending: Emergency Medicine | Admitting: Emergency Medicine

## 2021-03-20 ENCOUNTER — Other Ambulatory Visit: Payer: Self-pay

## 2021-03-20 DIAGNOSIS — I10 Essential (primary) hypertension: Secondary | ICD-10-CM | POA: Diagnosis not present

## 2021-03-20 DIAGNOSIS — Z79899 Other long term (current) drug therapy: Secondary | ICD-10-CM | POA: Insufficient documentation

## 2021-03-20 DIAGNOSIS — R110 Nausea: Secondary | ICD-10-CM | POA: Diagnosis not present

## 2021-03-20 DIAGNOSIS — U071 COVID-19: Secondary | ICD-10-CM | POA: Insufficient documentation

## 2021-03-20 DIAGNOSIS — R509 Fever, unspecified: Secondary | ICD-10-CM | POA: Diagnosis present

## 2021-03-20 LAB — CBC WITH DIFFERENTIAL/PLATELET
Abs Immature Granulocytes: 0.01 10*3/uL (ref 0.00–0.07)
Basophils Absolute: 0 10*3/uL (ref 0.0–0.1)
Basophils Relative: 0 %
Eosinophils Absolute: 0 10*3/uL (ref 0.0–0.5)
Eosinophils Relative: 0 %
HCT: 32.6 % — ABNORMAL LOW (ref 36.0–46.0)
Hemoglobin: 10 g/dL — ABNORMAL LOW (ref 12.0–15.0)
Immature Granulocytes: 0 %
Lymphocytes Relative: 26 %
Lymphs Abs: 0.6 10*3/uL — ABNORMAL LOW (ref 0.7–4.0)
MCH: 23 pg — ABNORMAL LOW (ref 26.0–34.0)
MCHC: 30.7 g/dL (ref 30.0–36.0)
MCV: 74.9 fL — ABNORMAL LOW (ref 80.0–100.0)
Monocytes Absolute: 0.4 10*3/uL (ref 0.1–1.0)
Monocytes Relative: 19 %
Neutro Abs: 1.3 10*3/uL — ABNORMAL LOW (ref 1.7–7.7)
Neutrophils Relative %: 55 %
Platelets: 247 10*3/uL (ref 150–400)
RBC: 4.35 MIL/uL (ref 3.87–5.11)
RDW: 21.5 % — ABNORMAL HIGH (ref 11.5–15.5)
WBC: 2.4 10*3/uL — ABNORMAL LOW (ref 4.0–10.5)
nRBC: 0 % (ref 0.0–0.2)

## 2021-03-20 LAB — COMPREHENSIVE METABOLIC PANEL
ALT: 10 U/L (ref 0–44)
AST: 16 U/L (ref 15–41)
Albumin: 4 g/dL (ref 3.5–5.0)
Alkaline Phosphatase: 59 U/L (ref 38–126)
Anion gap: 10 (ref 5–15)
BUN: 10 mg/dL (ref 6–20)
CO2: 24 mmol/L (ref 22–32)
Calcium: 9.5 mg/dL (ref 8.9–10.3)
Chloride: 105 mmol/L (ref 98–111)
Creatinine, Ser: 0.84 mg/dL (ref 0.44–1.00)
GFR, Estimated: >60 mL/min (ref >60)
Glucose, Bld: 91 mg/dL (ref 70–99)
Potassium: 3.3 mmol/L — ABNORMAL LOW (ref 3.5–5.1)
Sodium: 139 mmol/L (ref 135–145)
Total Bilirubin: 0.4 mg/dL (ref 0.3–1.2)
Total Protein: 7.8 g/dL (ref 6.5–8.1)

## 2021-03-20 LAB — RESP PANEL BY RT-PCR (FLU A&B, COVID) ARPGX2
Influenza A by PCR: NEGATIVE
Influenza B by PCR: NEGATIVE
SARS Coronavirus 2 by RT PCR: POSITIVE — AB

## 2021-03-20 LAB — LACTIC ACID, PLASMA: Lactic Acid, Venous: 0.7 mmol/L (ref 0.5–1.9)

## 2021-03-20 MED ORDER — ACETAMINOPHEN 500 MG PO TABS
1000.0000 mg | ORAL_TABLET | Freq: Once | ORAL | Status: AC
  Administered 2021-03-20: 1000 mg via ORAL
  Filled 2021-03-20: qty 2

## 2021-03-20 MED ORDER — SODIUM CHLORIDE 0.9 % IV BOLUS
1000.0000 mL | Freq: Once | INTRAVENOUS | Status: AC
  Administered 2021-03-20: 1000 mL via INTRAVENOUS

## 2021-03-20 MED ORDER — NIRMATRELVIR & RITONAVIR 10 X 150 MG & 10 X 100MG PO TBPK: 2.0000 | ORAL_TABLET | Freq: Two times a day (BID) | ORAL | 0 refills | Status: AC

## 2021-03-20 NOTE — ED Provider Notes (Signed)
Emergency Medicine Provider Triage Evaluation Note  Vanessa Benitez , a 50 y.o. female  was evaluated in triage.  Pt complains of fever and weakness.  Patient reports she for started feeling poorly on Sunday when she started to notice she had a poor appetite, over the next few days she developed severe generalized body aches, chills and fevers, febrile at 102.3 on arrival.  Reports some cough, no chest pain or shortness of breath, denies abdominal pain but did have an episode of vomiting this morning.  Denies urinary symptoms.  Denies any known sick contacts, has been vaccinated for [place].  Review of Systems  Positive: Fever, generalized weakness, body aches, cough, vomiting Negative: Chest pain, shortness of breath, abdominal pain, dysuria  Physical Exam  BP (!) 153/106   Pulse 93   Temp (!) 102.3 F (39.1 C)   Resp 20   Ht 5' (1.524 m)   Wt 74.8 kg   SpO2 98%   BMI 32.22 kg/m  Gen:   Awake, no distress   Resp:  Normal effort, CTA bilat MSK:   Moves extremities without difficulty  Other:  Abdomen is soft, nondistended, nontender to palpation in all quadrants.  Medical Decision Making  Medically screening exam initiated at 7:34 PM.  Appropriate orders placed.  Vanessa Benitez was informed that the remainder of the evaluation will be completed by another provider, this initial triage assessment does not replace that evaluation, and the importance of remaining in the ED until their evaluation is complete.     Jacqlyn Larsen, PA-C 03/20/21 1937    Hayden Rasmussen, MD 03/21/21 [phone]

## 2021-03-20 NOTE — ED Notes (Signed)
Pt states she had to use the restroom and could not wait for staff so she urinated in trash can. Pt advised to use call bell to notify staff the next time she has urge to use restroom.

## 2021-03-20 NOTE — ED Provider Notes (Signed)
[facility] DEPT Provider Note   CSN: [id number] Arrival date & time: 03/20/21  1902     History Chief Complaint  Patient presents with   Fever    Vanessa Benitez is a 50 y.o. female.  He has a history of hypertension.  She is complaining of feeling sick for 4 days.  It started with decreased appetite then went on with body aches chills fever.  Some nausea no vomiting.  No diarrhea.  No urinary symptoms.  No sick contacts or recent travel.  She is COVID vaccinated and boosted.  Minimal cough.  She is status post hysterectomy  The history is provided by the patient.  Influenza Presenting symptoms: cough, fatigue, fever, myalgias and nausea   Presenting symptoms: no shortness of breath, no sore throat and no vomiting   Fatigue:    Severity:  Moderate   Duration:  4 days   Timing:  Constant   Progression:  Unchanged Fever:    Duration:  2 days   Timing:  Intermittent   Max temp PTA:  104   Progression:  Unchanged Associated symptoms: chills, decreased appetite and decreased physical activity   Associated symptoms: no mental status change and no neck stiffness       Past Medical History:  Diagnosis Date   Hypertension     There are no problems to display for this patient.   Past Surgical History:  Procedure Laterality Date   CHOLECYSTECTOMY     cyst removed       OB History   No obstetric history on file.     Family History  Problem Relation Age of Onset   Hypertension Other    Cancer Other    Diabetes Other     Social History   Tobacco Use   Smoking status: Never   Smokeless tobacco: Never  Substance Use Topics   Alcohol use: Yes    Comment: rare   Drug use: No    Home Medications Prior to Admission medications   Medication Sig Start Date End Date Taking? Authorizing Provider  acetaminophen (TYLENOL) 500 MG tablet Take 500 mg by mouth every 6 (six) hours as needed for moderate pain or fever.    [provider]  azithromycin (ZITHROMAX) 250 MG tablet Take 1 tablet (250 mg total) by mouth daily. Take first 2 tablets together, then 1 every day until finished. 12/07/19   Volanda Napoleon, PA-C  Cholecalciferol (VITAMIN D PO) Take 1 tablet by mouth daily.    [provider]  ELDERBERRY PO Take 10 mLs by mouth daily as needed (For immune support).    [provider]  ibuprofen (ADVIL,MOTRIN) 200 MG tablet Take 400 mg by mouth every 6 (six) hours as needed for moderate pain.     [provider]  oseltamivir (TAMIFLU) 75 MG capsule Take 1 capsule (75 mg total) by mouth every 12 (twelve) hours. Patient not taking: Reported on 12/07/2019 11/23/16   Malvin Johns, MD  triamterene-hydrochlorothiazide (MAXZIDE-25) 37.5-25 MG tablet Take 1 tablet by mouth daily. Patient not taking: Reported on 12/07/2019 11/23/16   Malvin Johns, MD    Allergies    Lactose intolerance (gi), Lisinopril, and Penicillins  Review of Systems   Review of Systems  Constitutional:  Positive for chills, decreased appetite, fatigue and fever.  HENT:  Negative for sore throat.   Eyes:  Negative for visual disturbance.  Respiratory:  Positive for cough. Negative for shortness of breath.   Cardiovascular:  Negative for chest pain.  Gastrointestinal:  Positive for nausea. Negative for abdominal pain and vomiting.  Genitourinary:  Negative for dysuria.  Musculoskeletal:  Positive for myalgias. Negative for neck stiffness.  Skin:  Negative for rash.  Neurological:  Negative for syncope.   Physical Exam Updated Vital Signs BP (!) 153/106   Pulse 93   Temp (!) 102.3 F (39.1 C)   Resp 20   Ht 5' (1.524 m)   Wt 74.8 kg   SpO2 98%   BMI 32.22 kg/m   Physical Exam Vitals and nursing note reviewed.  Constitutional:      General: She is not in acute distress.    Appearance: Normal appearance. She is well-developed.  HENT:     Head: Normocephalic and atraumatic.  Eyes:     Conjunctiva/sclera:  Conjunctivae normal.  Cardiovascular:     Rate and Rhythm: Normal rate and regular rhythm.     Heart sounds: No murmur heard. Pulmonary:     Effort: Pulmonary effort is normal. No respiratory distress.     Breath sounds: Normal breath sounds.  Abdominal:     Palpations: Abdomen is soft.     Tenderness: There is no abdominal tenderness. There is no guarding or rebound.  Musculoskeletal:        General: No deformity or signs of injury. Normal range of motion.     Cervical back: Neck supple.  Skin:    General: Skin is warm and dry.  Neurological:     General: No focal deficit present.     Mental Status: She is alert.    ED Results / Procedures / Treatments   Labs (all labs ordered are listed, but only abnormal results are displayed) Labs Reviewed  RESP PANEL BY RT-PCR (FLU A&B, COVID) ARPGX2 - Abnormal; Notable for the following components:      Result Value   SARS Coronavirus 2 by RT PCR POSITIVE (*)    All other components within normal limits  COMPREHENSIVE METABOLIC PANEL - Abnormal; Notable for the following components:   Potassium 3.3 (*)    All other components within normal limits  CBC WITH DIFFERENTIAL/PLATELET - Abnormal; Notable for the following components:   WBC 2.4 (*)    Hemoglobin 10.0 (*)    HCT 32.6 (*)    MCV 74.9 (*)    MCH 23.0 (*)    RDW 21.5 (*)    Neutro Abs 1.3 (*)    Lymphs Abs 0.6 (*)    All other components within normal limits  LACTIC ACID, PLASMA    EKG EKG Interpretation  Date/Time:  Thursday March 20 2021 20:50:58 EDT Ventricular Rate:  76 PR Interval:  139 QRS Duration: 85 QT Interval:  421 QTC Calculation: 474 R Axis:   28 Text Interpretation: Sinus rhythm Probable left atrial enlargement No significant change since prior 3/18 Confirmed by Aletta Edouard [phone]) on 03/20/2021 9:06:16 PM  Radiology DG Chest Port 1 View  Result Date: 03/20/2021 CLINICAL DATA:  Lethargy with lack of appetite, fever and body aches. EXAM: PORTABLE  CHEST 1 VIEW COMPARISON:  January 19, 2015 FINDINGS: The heart size and mediastinal contours are within normal limits. Both lungs are clear. The visualized skeletal structures are unremarkable. IMPRESSION: No active disease. Electronically Signed   By: Virgina Norfolk M.D.   On: 03/20/2021 20:02    Procedures Procedures   Medications Ordered in ED Medications  sodium chloride 0.9 % bolus 1,000 mL (has no administration in time range)  acetaminophen (TYLENOL)  tablet 1,000 mg (1,000 mg Oral Given 03/20/21 2033)    ED Course  I have reviewed the triage vital signs and the nursing notes.  Pertinent labs & imaging results that were available during my care of the patient were reviewed by me and considered in my medical decision making (see chart for details).  Clinical Course as of 03/21/21 1420  Thu Mar 20, 2021  2216 Labs showing low white count possibly reflective of viral infection, hemoglobin slightly lower than baseline, normal platelets.  Chemistries and LFTs normal other than mildly low potassium.  Lactate not elevated [MB]    Clinical Course User Index [MB] Hayden Rasmussen, MD   MDM Rules/Calculators/A&P                         Vanessa Benitez was evaluated in Emergency Department on 03/20/2021 for the symptoms described in the history of present illness. She was evaluated in the context of the global COVID-19 pandemic, which necessitated consideration that the patient might be at risk for infection with the SARS-CoV-2 virus that causes COVID-19. Institutional protocols and algorithms that pertain to the evaluation of patients at risk for COVID-19 are in a state of rapid change based on information released by regulatory bodies including the CDC and federal and state organizations. These policies and algorithms were followed during the patient's care in the ED.  This patient complains of fevers chills headache body aches cough; this involves an extensive number of treatment Options and  is a complaint that carries with it a high risk of complications and Morbidity. The differential includes COVID, flu, pneumonia, viral syndrome  I ordered, reviewed and interpreted labs, which included CBC with a depressed white count possibly reflecting viral infection, hemoglobin low similar to priors, chemistries fairly normal, lactate not elevated, COVID test positive I ordered medication IV fluids and Tylenol with improvement in her symptoms I ordered imaging studies which included chest x-ray and I independently    visualized and interpreted imaging which showed no acute findings  Previous records obtained and reviewed in epic, no recent admissions  After the interventions stated above, I reevaluated the patient and found patient's vitals remained stable.  She remains hypertensive.  On review of prior visits seems baseline for her.  Thinking that her symptoms will limit going on for days she is a candidate for paxlovid.  Return instructions discussed   Final Clinical Impression(s) / ED Diagnoses Final diagnoses:  COVID-19 virus infection    Rx / DC Orders ED Discharge Orders          Ordered    nirmatrelvir/ritonavir EUA, renal dosing, (PAXLOVID) TBPK  2 times daily        03/20/21 2253             Hayden Rasmussen, MD 03/21/21 1422

## 2021-03-20 NOTE — Discharge Instructions (Addendum)
You were seen in the emergency department for evaluation of fever body aches decreased appetite.  You had lab work EKG chest x-ray and a COVID swab.  You tested positive for COVID.  We are prescribing you a pill form for monoclonal antibody.  He should isolate at home and drink plenty of fluids, Tylenol and ibuprofen for fever.  There is a COVID clinic at [facility] if you need follow-up care.  Return to the emergency department if any worsening or concerning symptoms

## 2021-03-20 NOTE — ED Notes (Signed)
Pt verbalized understanding of d/c, medication, and follow up care. Ambulatory with steady gait.  

## 2021-03-20 NOTE — ED Triage Notes (Signed)
Pt to ED from home with c/o lethargy, lack of appetite, fever (102.3) and body aches, no known covid exposure. Symptoms began 3 days ago.

## 2021-03-20 NOTE — ED Notes (Signed)
Pt advised she was unable to void at this time for urine sample, informed pt that she can provide a sample as soon as she is able to void.

## 2021-11-27 ENCOUNTER — Other Ambulatory Visit: Payer: Self-pay

## 2021-11-27 ENCOUNTER — Ambulatory Visit
Admission: EM | Admit: 2021-11-27 | Discharge: 2021-11-27 | Disposition: A | Discharge status: Home / Self Care | Payer: Self-pay

## 2021-11-27 ENCOUNTER — Ambulatory Visit (INDEPENDENT_AMBULATORY_CARE_PROVIDER_SITE_OTHER): Payer: Self-pay

## 2021-11-27 DIAGNOSIS — J209 Acute bronchitis, unspecified: Secondary | ICD-10-CM

## 2021-11-27 DIAGNOSIS — R0602 Shortness of breath: Secondary | ICD-10-CM

## 2021-11-27 DIAGNOSIS — R079 Chest pain, unspecified: Secondary | ICD-10-CM | POA: Diagnosis not present

## 2021-11-27 DIAGNOSIS — R059 Cough, unspecified: Secondary | ICD-10-CM

## 2021-11-27 DIAGNOSIS — R052 Subacute cough: Secondary | ICD-10-CM

## 2021-11-27 DIAGNOSIS — I10 Essential (primary) hypertension: Secondary | ICD-10-CM

## 2021-11-27 DIAGNOSIS — R030 Elevated blood-pressure reading, without diagnosis of hypertension: Secondary | ICD-10-CM

## 2021-11-27 DIAGNOSIS — R062 Wheezing: Secondary | ICD-10-CM

## 2021-11-27 DIAGNOSIS — R52 Pain, unspecified: Secondary | ICD-10-CM

## 2021-11-27 MED ORDER — TRIAMTERENE-HCTZ 37.5-25 MG PO TABS: 1.0000 | ORAL_TABLET | Freq: Every day | ORAL | 0 refills | Status: AC

## 2021-11-27 MED ORDER — BENZONATATE 100 MG PO CAPS
100.0000 mg | ORAL_CAPSULE | Freq: Three times a day (TID) | ORAL | 0 refills | Status: AC | PRN
Start: 2021-11-27 — End: ?

## 2021-11-27 MED ORDER — PREDNISONE 20 MG PO TABS: ORAL_TABLET | ORAL | 0 refills | Status: DC

## 2021-11-27 MED ORDER — ALBUTEROL SULFATE HFA 108 (90 BASE) MCG/ACT IN AERS
1.0000 | INHALATION_SPRAY | Freq: Four times a day (QID) | RESPIRATORY_TRACT | 0 refills | Status: AC | PRN
Start: 2021-11-27 — End: ?

## 2021-11-27 MED ORDER — PROMETHAZINE-DM 6.25-15 MG/5ML PO SYRP
5.0000 mL | ORAL_SOLUTION | Freq: Every evening | ORAL | 0 refills | Status: DC | PRN
Start: 2021-11-27 — End: 2024-01-13

## 2021-11-27 NOTE — ED Provider Notes (Signed)
?[place] ? ? ?MRN: [id number] DOB: 07-Feb-1971 ? ?Subjective:  ? ?Vanessa Benitez is a 51 y.o. female with PMH of essential hypertension presenting for 2-day history of acute onset persistent productive cough, chest pain, shortness of breath, wheezing, body aches, chills. No history of asthma. No smoking.  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  She reports that she has exposure to their animals now.  Otherwise cannot think of any other specific factors that might have aggravated her breathing.  She did a COVID test at home and was negative but is not opposed to having a repeat.  Regarding her blood pressure, patient reports that she was taken off of blood pressure medications due to her weight loss and lower blood pressure readings.  She does have a PCP but has not followed up with them about this.  No diaphoresis, nausea, vomiting, severe headache, confusion, weakness, numbness or tingling.  No history of stroke, MI or heart disease. ? ?No current facility-administered medications for this encounter. ? ?Current Outpatient Medications:  ?  folic acid (FOLVITE) 1 MG tablet, Take 1 mg by mouth daily., Disp: , Rfl:  ?  acetaminophen (TYLENOL) 500 MG tablet, Take 500 mg by mouth every 6 (six) hours as needed for moderate pain or fever., Disp: , Rfl:  ?  Cholecalciferol (VITAMIN D PO), Take 1 tablet by mouth daily., Disp: , Rfl:  ?  ELDERBERRY PO, Take 10 mLs by mouth daily as needed (For immune support)., Disp: , Rfl:  ?  ibuprofen (ADVIL,MOTRIN) 200 MG tablet, Take 400 mg by mouth every 6 (six) hours as needed for moderate pain. , Disp: , Rfl:  ?  triamterene-hydrochlorothiazide (MAXZIDE-25) 37.5-25 MG tablet, Take 1 tablet by mouth daily. (Patient not taking: Reported on 12/07/2019), Disp: 30 tablet, Rfl: 0  ? ?Allergies  ?Allergen Reactions  ? Lactose Nausea And Vomiting  ?  G.I. Upset ?G.I. Upset ?G.I. Upset ?G.I. Upset ?  ? Aspirin   ? Lactose  Intolerance (Gi)   ?  G.I. Upset  ? Lisinopril Other (See Comments)  ?  Sore throat, cough  ? Penicillins   ?  Has patient had a PCN reaction causing immediate rash, facial/tongue/throat swelling, SOB or lightheadedness with hypotension: no ?Has patient had a PCN reaction causing severe rash involving mucus membranes or skin necrosis: no ?Has patient had a PCN reaction that required hospitalization: no ?Has patient had a PCN reaction occurring within the last 10 years: yes, December 2017 ?If all of the above answers are "NO", then may proceed with Cephalosporin use.  ? ? ?Past Medical History:  ?Diagnosis Date  ? Hypertension   ?  ? ?Past Surgical History:  ?Procedure Laterality Date  ? CHOLECYSTECTOMY    ? cyst removed    ? ? ?Family History  ?Problem Relation Age of Onset  ? Hypertension Other   ? Cancer Other   ? Diabetes Other   ? ? ?Social History  ? ?Tobacco Use  ? Smoking status: Never  ? Smokeless tobacco: Never  ?Substance Use Topics  ? Alcohol use: Yes  ?  Comment: rare  ? Drug use: No  ? ? ?ROS ? ? ?Objective:  ? ?Vitals: ?BP (!) 160/112 (BP Location: Right Arm)   Pulse (!) 102   Temp 98.8 ?F (37.1 ?C) (Oral)   Resp (!) 22   LMP 11/15/2019   SpO2 95%  ? ?BP Readings from Last 3 Encounters:  ?  11/27/21 (!) 160/112  ?03/20/21 (!) 154/99  ?12/07/19 (!) 152/88  ? ?Physical Exam ?Constitutional:   ?   General: She is not in acute distress. ?   Appearance: Normal appearance. She is well-developed. She is not ill-appearing, toxic-appearing or diaphoretic.  ?HENT:  ?   Head: Normocephalic and atraumatic.  ?   Nose: Nose normal.  ?   Mouth/Throat:  ?   Mouth: Mucous membranes are moist.  ?Eyes:  ?   General: No scleral icterus.    ?   Right eye: No discharge.     ?   Left eye: No discharge.  ?   Extraocular Movements: Extraocular movements intact.  ?Cardiovascular:  ?   Rate and Rhythm: Normal rate.  ?   Heart sounds: No murmur heard. ?  No friction rub. No gallop.  ?Pulmonary:  ?   Effort: Pulmonary effort is  normal. No accessory muscle usage, prolonged expiration or respiratory distress.  ?   Breath sounds: No stridor. Examination of the right-upper field reveals wheezing. Examination of the left-upper field reveals wheezing. Examination of the right-middle field reveals wheezing and rhonchi. Examination of the left-middle field reveals wheezing and rhonchi. Examination of the right-lower field reveals wheezing and rhonchi. Examination of the left-lower field reveals wheezing and rhonchi. Wheezing and rhonchi present. No decreased breath sounds or rales.  ?   Comments: No cyanotic or pursed lips. ?Chest:  ?   Chest wall: No tenderness.  ?Skin: ?   General: Skin is warm and dry.  ?Neurological:  ?   General: No focal deficit present.  ?   Mental Status: She is alert and oriented to person, place, and time.  ?Psychiatric:     ?   Mood and Affect: Mood normal.     ?   Behavior: Behavior normal.  ? ?DG Chest 2 View ? ?Result Date: 11/27/2021 ?CLINICAL DATA:  Cough, shortness of breath EXAM: CHEST - 2 VIEW COMPARISON:  03/20/2021 FINDINGS: Cardiac size is within normal limits. There is mild peribronchial thickening. There is no focal pulmonary consolidation. There is no pleural effusion or pneumothorax. Surgical clips are seen in gallbladder fossa. IMPRESSION: Peribronchial thickening suggests bronchitis. There are no focal pulmonary infiltrates. There is no pleural effusion. Electronically Signed   By: Elmer Picker M.D.   On: 11/27/2021 15:35   ? ?ED ECG REPORT ? ? Date: 11/27/2021 ? EKG Time: 3:49 PM ? Rate: 95 bpm ? Rhythm: normal sinus rhythm,  unchanged from previous tracings ? Axis: left ? Intervals: QT prolongation ? ST&T Change: Non-specific t-wave flattening, I-III, aVF.  ? Narrative Interpretation: Sinus rhythm at 95 bpm with nonspecific T wave changes.  There is also QT prolongation.  No acute findings, per comparable to previous EKG. ? ?Assessment and Plan :  ? ?PDMP not reviewed this encounter. ? ?1. Acute  bronchitis, unspecified organism   ?2. Subacute cough   ?3. Body aches   ?4. Chest pain, unspecified type   ?5. Shortness of breath   ?6. Wheezing   ?7. Essential hypertension   ?8. Elevated blood pressure reading   ? ?Emphasized need for follow-up with her PCP regarding her blood pressure as she has had persistent elevated readings. No signs of an acute encephalopathy. Regarding her chest pain, I do not suspect that this is due to a cardiac event.  However, I will start her blood pressure medication again especially with our treatment plan of using steroids for her breathing, acute bronchitis.  Use supportive care  otherwise.  COVID, flu testing pending. Counseled patient on potential for adverse effects with medications prescribed/recommended today, ER and return-to-clinic precautions discussed, patient verbalized understanding. ? ?  ?Jaynee Eagles, PA-C ?11/27/21 1552 ? ?

## 2021-11-27 NOTE — ED Triage Notes (Signed)
Pt reports cough and body aches x 2 days.  ? ?Pt reports swelling and pain in hands at night "feels like having a rubber band"  ?

## 2021-11-28 LAB — COVID-19, FLU A+B NAA
Influenza A, NAA: NOT DETECTED
Influenza B, NAA: NOT DETECTED
SARS-CoV-2, NAA: NOT DETECTED

## 2021-12-22 IMAGING — CT CT NECK W/ CM
3 of 5 series · 13 of 33 positions shown, 16 images · IV contrast (APPLIED)
Comparison: None.

CLINICAL DATA: Cervical lymphadenopathy

EXAM:
CT NECK WITH CONTRAST
TECHNIQUE: Multidetector CT imaging of the neck was performed using the
standard protocol following the bolus administration of intravenous
contrast.
CONTRAST:  75mL OMNIPAQUE IOHEXOL 300 MG/ML  SOLN

[Series 7: coronal st · coronal · 0.43mm/px · 3 of 131 slices shown]
[im 27/131  bone]
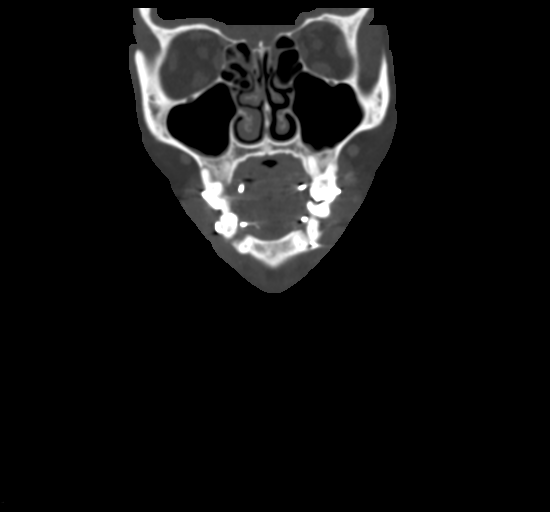
[im 53/131  bone]
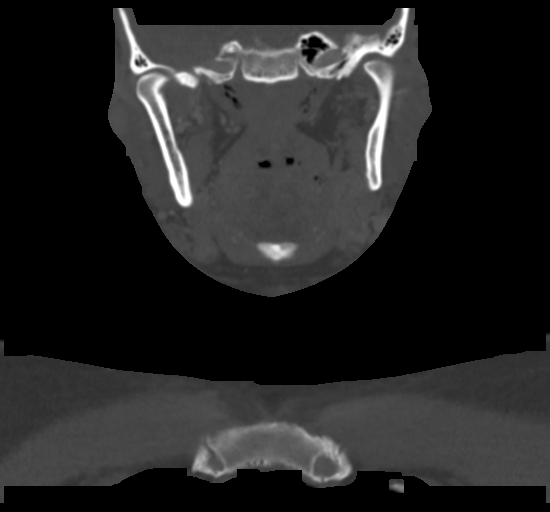
[im 79/131  bone]
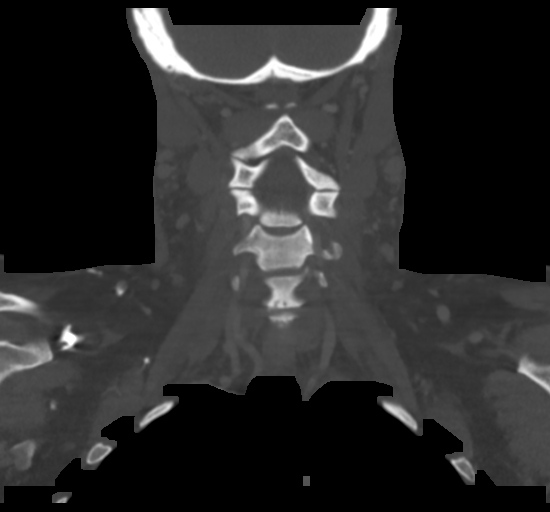

[Series 8: sagittal st · sagittal · 0.43mm/px · 5 of 101 slices shown, 6 images]
[im 34/101  bone]
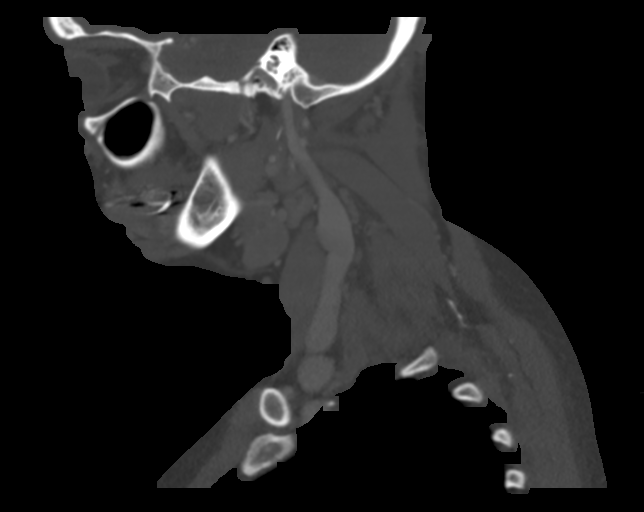
[im 42/101  bone]
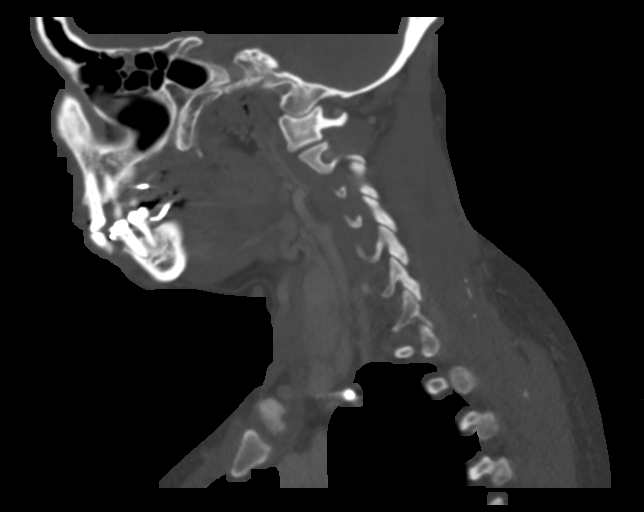
[im 51/101  soft-tissue]
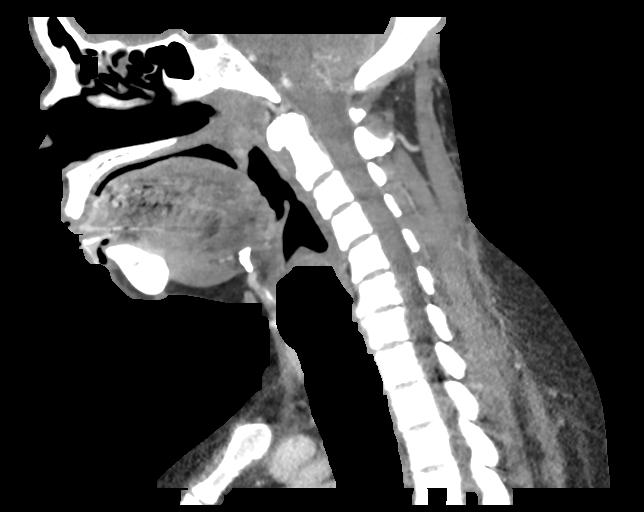
[im 51/101  bone]
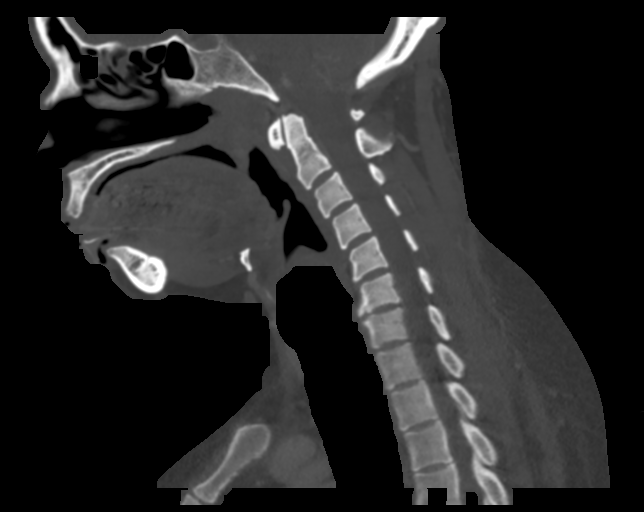
[im 59/101  bone]
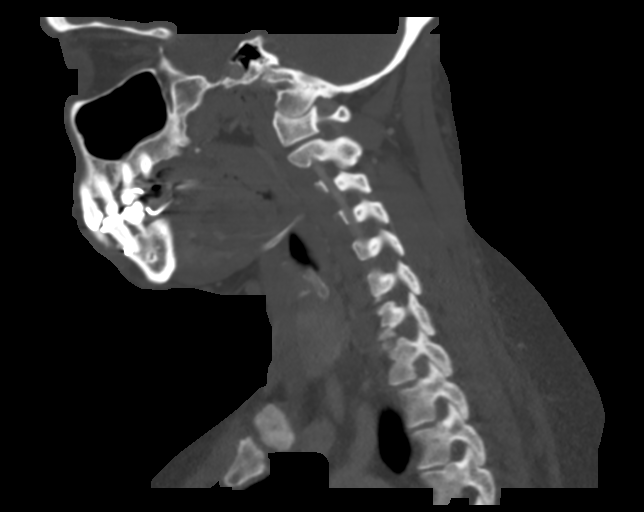
[im 67/101  bone]
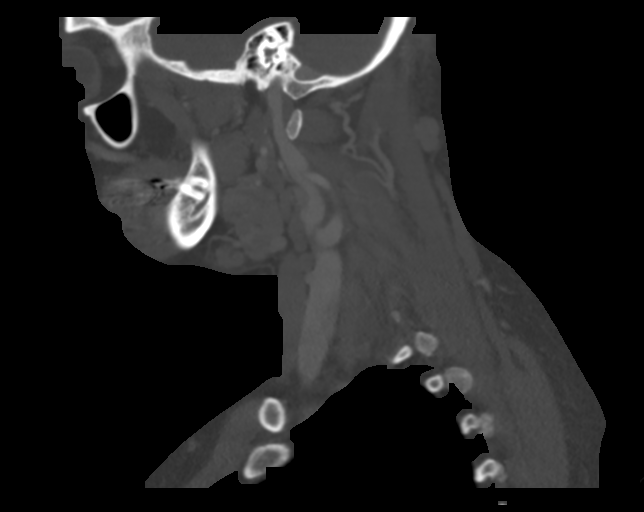

[Series 9: orthogonal st · axial · 0.39mm/px · z∈[-259,-94]mm · 5 of 132 slices shown, 7 images]
[im 22/132  soft-tissue]
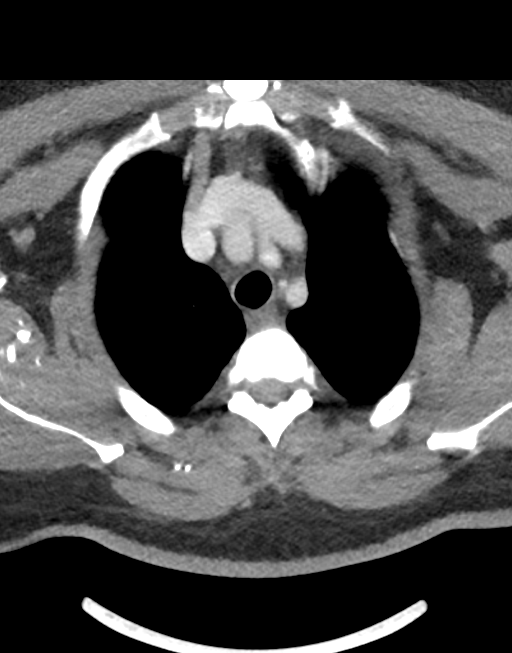
[im 22/132  bone]
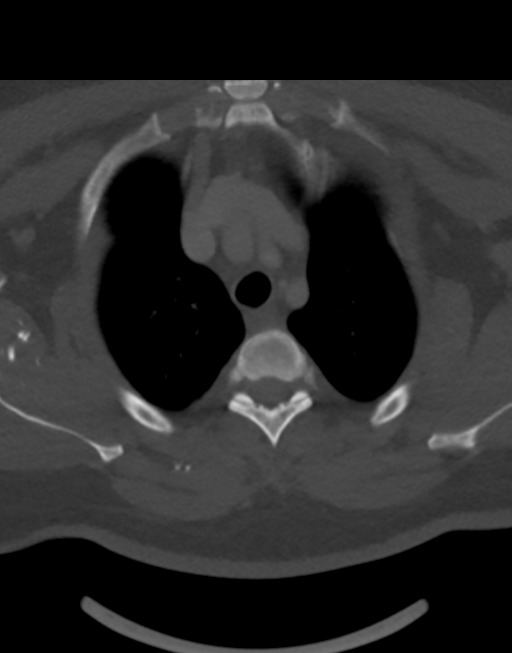
[im 44/132  bone]
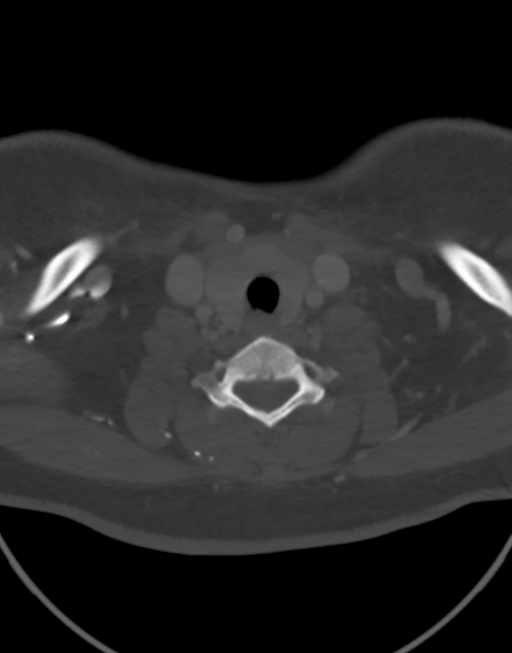
[im 66/132  bone]
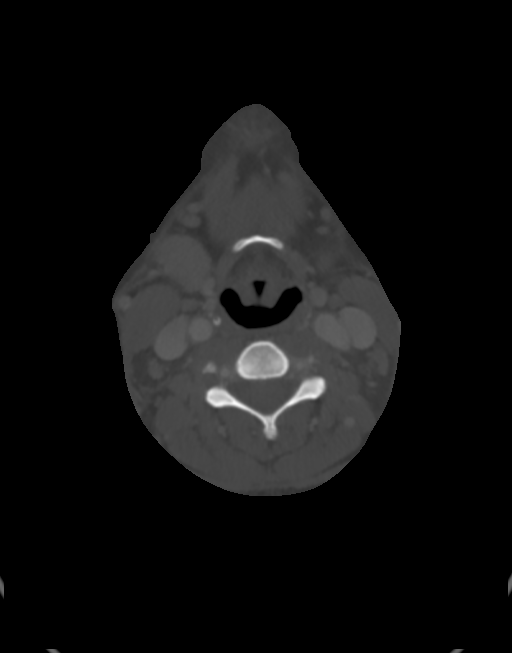
[im 88/132  bone]
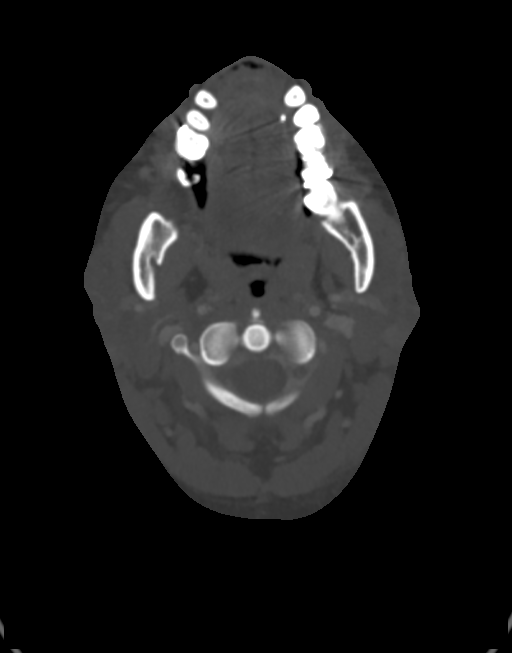
[im 110/132  soft-tissue]
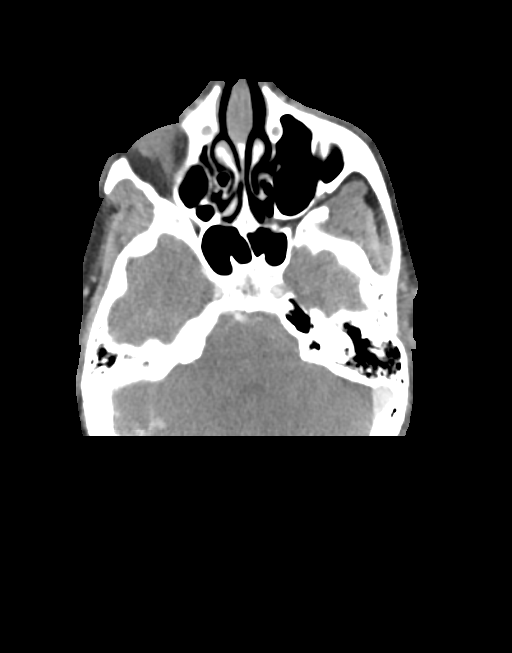
[im 110/132  bone]
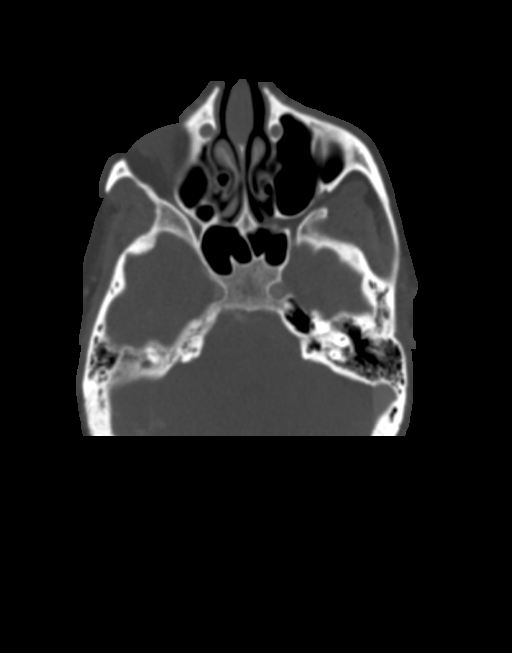

[13 of 33 positions shown; findings below may reference images not displayed]

FINDINGS: PHARYNX AND LARYNX: The adenoid tonsils are enlarged. No
peritonsillar or retropharyngeal fluid collection. Normal oral
cavity. Normal larynx and epiglottis.

SALIVARY GLANDS: There are multiple bilateral intraparotid lymph
nodes measuring up to 7 mm.

THYROID: Normal.

LYMPH NODES: There are numerous bilateral subcentimeter lymph nodes.
These are predominantly at levels 5A and 5 B. Left level 5 a lymph
node measures 9 mm (image 35). Posterior triangle lymph node on the
left measures 7 mm (image 25).

VASCULAR: Major cervical vessels are patent.

LIMITED INTRACRANIAL: Normal.

VISUALIZED ORBITS: Normal.

MASTOIDS AND VISUALIZED PARANASAL SINUSES: No fluid levels or
advanced mucosal thickening. No mastoid effusion.

SKELETON: No bony spinal canal stenosis. No lytic or blastic
lesions.

UPPER CHEST: Incompletely visualized 4 mm nodule in the right upper
lobe.

OTHER: None.
IMPRESSION: 1. Enlarged adenoid tonsils, which may indicate acute
tonsillopharyngitis. No peritonsillar or retropharyngeal fluid
collection.
2. Multiple bilateral subcentimeter cervical lymph nodes, which are
nonspecific, but may be reactive if there are symptoms of infection.
A lymphoproliferative process is also possible, but not as likely.
3. Incompletely visualized 4 mm nodule in the right upper lobe.
Follow-up chest CT recommended in 3-6 months for complete
visualization and to establish follow-up.

## 2022-12-30 ENCOUNTER — Emergency Department (HOSPITAL_COMMUNITY): Payer: Medicaid Other

## 2022-12-30 ENCOUNTER — Emergency Department (HOSPITAL_COMMUNITY)
Admission: EM | Admit: 2022-12-30 | Discharge: 2022-12-30 | Disposition: A | Discharge status: Home / Self Care | Payer: Medicaid Other | Attending: Emergency Medicine | Admitting: Emergency Medicine

## 2022-12-30 ENCOUNTER — Other Ambulatory Visit: Payer: Self-pay

## 2022-12-30 DIAGNOSIS — N611 Abscess of the breast and nipple: Secondary | ICD-10-CM | POA: Diagnosis not present

## 2022-12-30 DIAGNOSIS — M25462 Effusion, left knee: Secondary | ICD-10-CM

## 2022-12-30 DIAGNOSIS — M25561 Pain in right knee: Secondary | ICD-10-CM | POA: Diagnosis not present

## 2022-12-30 DIAGNOSIS — M25522 Pain in left elbow: Secondary | ICD-10-CM | POA: Diagnosis not present

## 2022-12-30 DIAGNOSIS — M25461 Effusion, right knee: Secondary | ICD-10-CM | POA: Diagnosis not present

## 2022-12-30 MED ORDER — FLUCONAZOLE 150 MG PO TABS: 150.0000 mg | ORAL_TABLET | Freq: Every day | ORAL | 1 refills | Status: AC

## 2022-12-30 MED ORDER — CELECOXIB 200 MG PO CAPS: 200.0000 mg | ORAL_CAPSULE | Freq: Two times a day (BID) | ORAL | 0 refills | Status: DC

## 2022-12-30 MED ORDER — DOXYCYCLINE HYCLATE 100 MG PO CAPS: 100.0000 mg | ORAL_CAPSULE | Freq: Two times a day (BID) | ORAL | 0 refills | Status: DC

## 2022-12-30 MED ORDER — LIDOCAINE-EPINEPHRINE (PF) 2 %-1:200000 IJ SOLN
10.0000 mL | Freq: Once | INTRAMUSCULAR | Status: AC
  Administered 2022-12-30: 10 mL
  Filled 2022-12-30: qty 20

## 2022-12-30 NOTE — ED Provider Notes (Signed)
[facility]ERG[facility] DEPARTMENT AT [facility] Provider Note   CSN: [id number] Arrival date & time: 12/30/22  1236     History  Chief Complaint  Patient presents with   Knee Pain   Breast Pain    Vanessa Benitez is a 52 y.o. female who presents emergency department chief complaint of pain behind her right areola and bilateral knee pain and swelling.  Patient states that she has been having pain behind the right areola, swelling tenderness and redness which is new.  She denies any nipple discharge or changes in skin.  Patient also complaining of bilateral knee pain.  She has been having some knee pain for about 3 to 4 days but this morning she felt like both knees were swollen she was having difficulty walking on her knees and states that she "almost fell" because her knee gave way on the left.  She does not have a known history of knee issues or arthritis.  She did not take anything for that prior to arrival.   Knee Pain      Home Medications Prior to Admission medications   Medication Sig Start Date End Date Taking? Authorizing Provider  acetaminophen (TYLENOL) 500 MG tablet Take 500 mg by mouth every 6 (six) hours as needed for moderate pain or fever.    [provider]  albuterol (VENTOLIN HFA) 108 (90 Base) MCG/ACT inhaler Inhale 1-2 puffs into the lungs every 6 (six) hours as needed for wheezing or shortness of breath. 11/27/21   Wallis Bamberg, PA-C  benzonatate (TESSALON) 100 MG capsule Take 1-2 capsules (100-200 mg total) by mouth 3 (three) times daily as needed for cough. 11/27/21   Wallis Bamberg, PA-C  Cholecalciferol (VITAMIN D PO) Take 1 tablet by mouth daily.    [provider]  ELDERBERRY PO Take 10 mLs by mouth daily as needed (For immune support).    [provider]  folic acid (FOLVITE) 1 MG tablet Take 1 mg by mouth daily.    [provider]  ibuprofen (ADVIL,MOTRIN) 200 MG tablet Take 400 mg by mouth every 6 (six) hours as needed  for moderate pain.     [provider]  predniSONE (DELTASONE) 20 MG tablet Take 2 tablets daily with breakfast. 11/27/21   Wallis Bamberg, PA-C  promethazine-dextromethorphan (PROMETHAZINE-DM) 6.25-15 MG/5ML syrup Take 5 mLs by mouth at bedtime as needed for cough. 11/27/21   Wallis Bamberg, PA-C  triamterene-hydrochlorothiazide (MAXZIDE-25) 37.5-25 MG tablet Take 1 tablet by mouth daily. 11/27/21   Wallis Bamberg, PA-C      Allergies    Lactose, Aspirin, Lactose intolerance (gi), Lisinopril, and Penicillins    Review of Systems   Review of Systems  Physical Exam Updated Vital Signs BP (!) 148/96 (BP Location: Right Wrist)   Pulse 80   Temp 98.8 F (37.1 C) (Oral)   Resp 18   Ht 5' (1.524 m)   Wt 103.9 kg   LMP 11/15/2019   SpO2 100%   BMI 44.72 kg/m  Physical Exam Vitals and nursing note reviewed. Exam conducted with a chaperone present [name], PA-S).  Constitutional:      General: She is not in acute distress.    Appearance: She is well-developed. She is not diaphoretic.  HENT:     Head: Normocephalic and atraumatic.     Right Ear: External ear normal.     Left Ear: External ear normal.     Nose: Nose normal.     Mouth/Throat:  Mouth: Mucous membranes are moist.  Eyes:     General: No scleral icterus.    Conjunctiva/sclera: Conjunctivae normal.  Cardiovascular:     Rate and Rhythm: Normal rate and regular rhythm.     Heart sounds: Normal heart sounds. No murmur heard.    No friction rub. No gallop.  Pulmonary:     Effort: Pulmonary effort is normal. No respiratory distress.     Breath sounds: Normal breath sounds.  Chest:     Comments: Tenderness induration and redness no noted superior on the right areola.  There is mobile indurated region behind the areola there is central fluctuance noted. Abdominal:     General: Bowel sounds are normal. There is no distension.     Palpations: Abdomen is soft. There is no mass.     Tenderness: There is no abdominal  tenderness. There is no guarding.  Musculoskeletal:     Cervical back: Normal range of motion.     Comments: No obvious swelling bilaterally on the knees, there appears to be some varus deformity bilaterally.  Full range of motion of the bilateral knees.  Skin:    General: Skin is warm and dry.  Neurological:     Mental Status: She is alert and oriented to person, place, and time.  Psychiatric:        Behavior: Behavior normal.    ED Results / Procedures / Treatments   Labs (all labs ordered are listed, but only abnormal results are displayed) Labs Reviewed  BASIC METABOLIC PANEL  CBC WITH DIFFERENTIAL/PLATELET    EKG None  Radiology No results found.  Procedures .Marland Kitchen.Incision and Drainage  Date/Time: 12/30/2022 7:45 PM  Performed by: Arthor CaptainHarris, Flannery Cavallero, PA-C Authorized by: Arthor CaptainHarris, Katheren Jimmerson, PA-C   Consent:    Consent obtained:  Verbal   Consent given by:  Patient   Risks discussed:  Bleeding, incomplete drainage, pain and damage to other organs   Alternatives discussed:  No treatment Universal protocol:    Procedure explained and questions answered to patient or proxy's satisfaction: yes     Relevant documents present and verified: yes     Test results available : yes     Imaging studies available: yes     Required blood products, implants, devices, and special equipment available: yes     Site/side marked: yes     Immediately prior to procedure, a time out was called: yes     Patient identity confirmed:  Verbally with patient Location:    Type:  Abscess   Location:  Trunk   Trunk location:  R breast Pre-procedure details:    Skin preparation:  Betadine Anesthesia:    Anesthesia method:  Local infiltration   Local anesthetic:  Lidocaine 2% WITH epi Procedure details:    Needle aspiration: yes     Needle size:  18 G   Drainage:  Purulent   Drainage amount:  Scant Post-procedure details:    Procedure completion:  Tolerated well, no immediate complications      Medications Ordered in ED Medications - No data to display  ED Course/ Medical Decision Making/ A&P                             Medical Decision Making Amount and/or Complexity of Data Reviewed Labs: ordered. Radiology: ordered.  Risk Prescription drug management.   Patient here with right breast abscess.  This was aspirated in the emergency department.  Will discharge with doxycycline.  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  Patient also reports get yeast infections when she has antibiotics and requests Diflucan.  She is also noted to have a that effusion on the left knee.  I ordered and interpreted bilateral knee x-rays.  No acute findings on the right, effusion on the left.  Patient was discharged with Celebrex, doxycycline, Diflucan, outpatient follow-up and return precautions.  She appears otherwise appropriate for discharge at this time.  She understands that she should follow-up with her PCP regarding further assessment of her breast with radiology and ultrasound guidance.        Final Clinical Impression(s) / ED Diagnoses Final diagnoses:  None    Rx / DC Orders ED Discharge Orders     None         Arthor Captain, PA-C 12/30/22 1949    Gerhard Munch, MD 01/04/23 [phone]

## 2022-12-30 NOTE — Discharge Instructions (Addendum)
Contact a health care provider if: You see redness that spreads quickly or red streaks on your skin spreading away from the abscess. You have any signs of worse infection at the abscess. You vomit every time you eat or drink. You have a fever, chills, or muscle aches. The cyst or abscess returns.

## 2022-12-30 NOTE — ED Triage Notes (Signed)
Bilateral knee swelling x 2 weeks denies injury. Worse today complains of difficulty getting out of bed. Pt ambualted to triage with steady gait. Also complains of painful knot on right breast first noticed 8 days ago. Denies redness or drainage.

## 2023-12-13 IMAGING — DX DG CHEST 2V
2 series · 2 of 2 positions shown · non-contrast
Comparison: 03/20/2021

CLINICAL DATA: Cough, shortness of breath

EXAM:
CHEST - 2 VIEW

[chest pa]
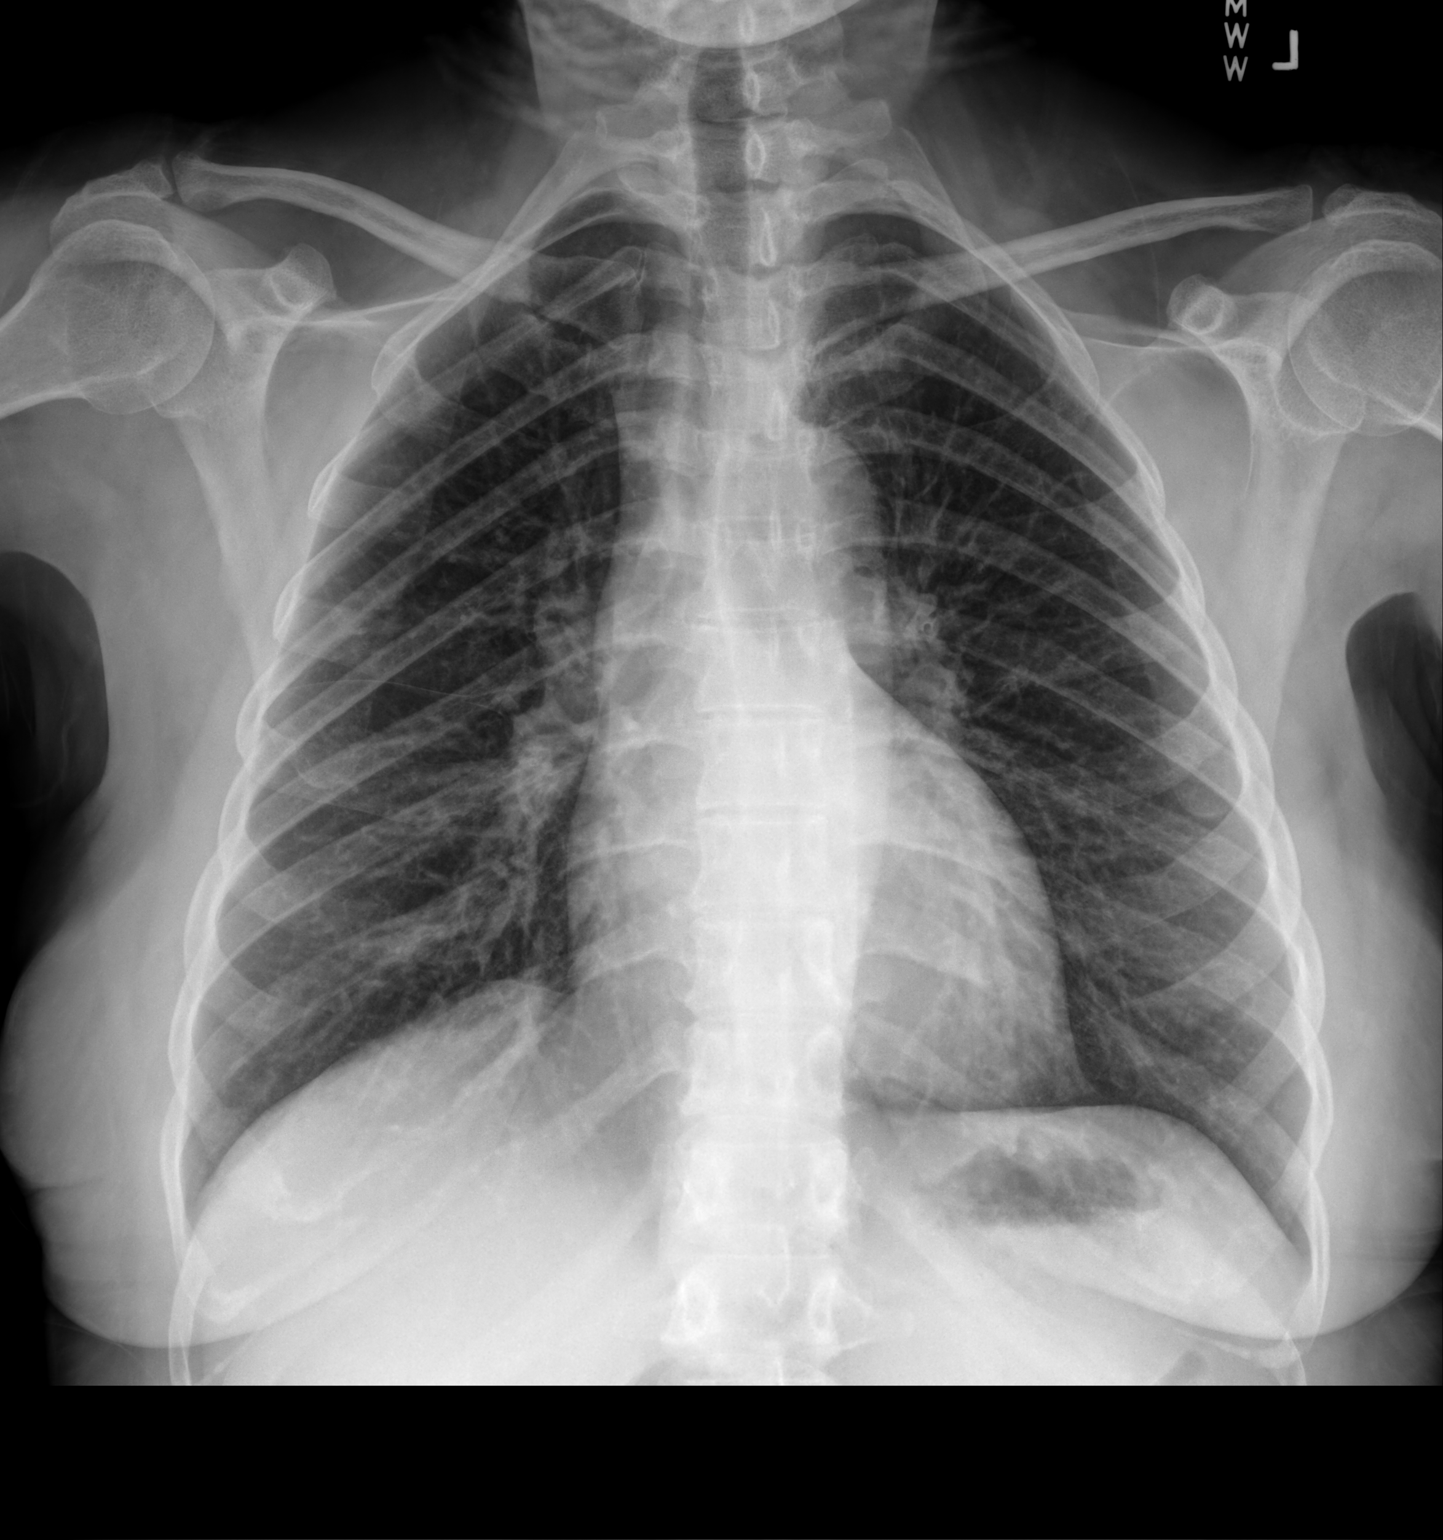

[chest lat]
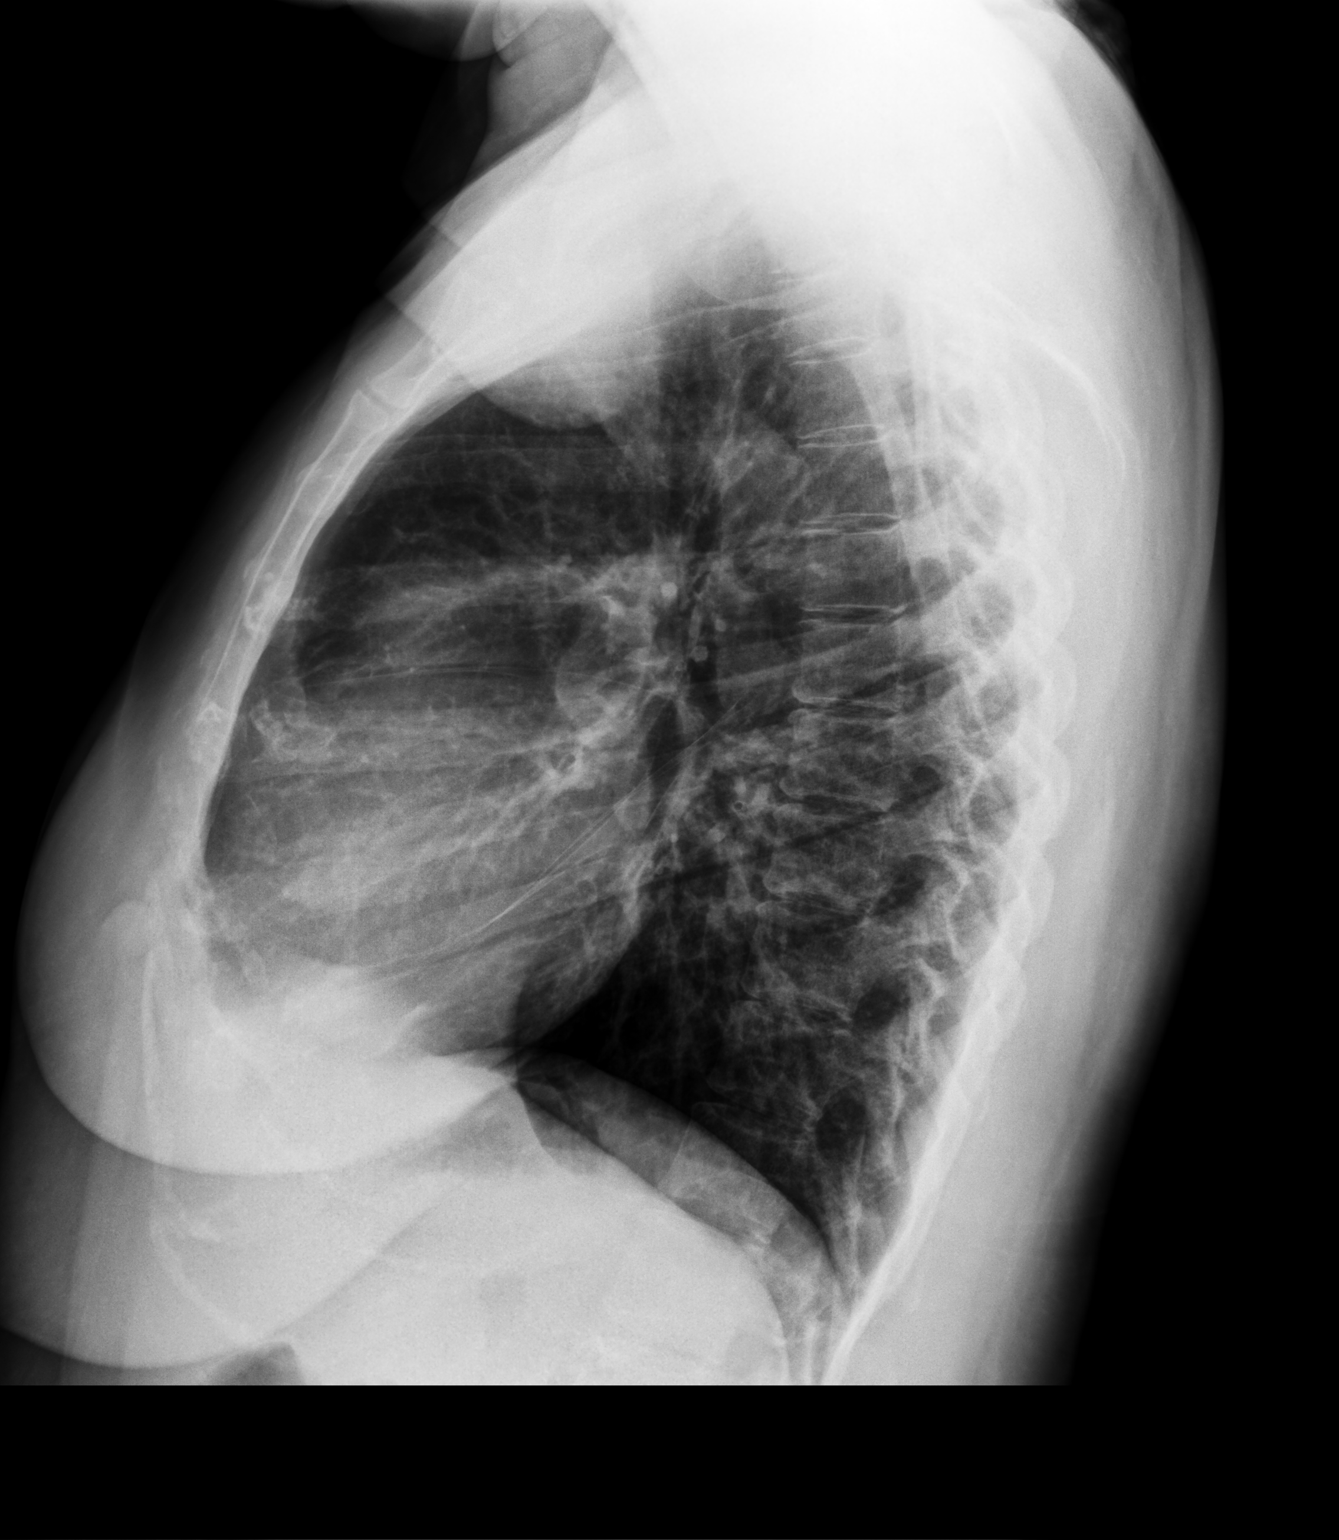

[2 of 2 positions shown; findings below may reference images not displayed]

FINDINGS: Cardiac size is within normal limits. There is mild peribronchial
thickening. There is no focal pulmonary consolidation. There is no
pleural effusion or pneumothorax. Surgical clips are seen in
gallbladder fossa.
IMPRESSION: Peribronchial thickening suggests bronchitis. There are no focal
pulmonary infiltrates. There is no pleural effusion.

## 2024-01-11 ENCOUNTER — Other Ambulatory Visit: Payer: Self-pay | Admitting: Otolaryngology

## 2024-01-13 ENCOUNTER — Encounter (HOSPITAL_BASED_OUTPATIENT_CLINIC_OR_DEPARTMENT_OTHER): Payer: Self-pay | Admitting: Otolaryngology

## 2024-01-13 ENCOUNTER — Other Ambulatory Visit: Payer: Self-pay

## 2024-01-17 ENCOUNTER — Encounter (HOSPITAL_BASED_OUTPATIENT_CLINIC_OR_DEPARTMENT_OTHER)
Admission: RE | Admit: 2024-01-17 | Discharge: 2024-01-17 | Disposition: A | Discharge status: Home / Self Care | Source: Ambulatory Visit | Attending: Otolaryngology | Admitting: Otolaryngology

## 2024-01-17 DIAGNOSIS — Z01812 Encounter for preprocedural laboratory examination: Secondary | ICD-10-CM | POA: Diagnosis present

## 2024-01-17 LAB — BASIC METABOLIC PANEL WITH GFR
Anion gap: 9 (ref 5–15)
BUN: 11 mg/dL (ref 6–20)
CO2: 27 mmol/L (ref 22–32)
Calcium: 9.3 mg/dL (ref 8.9–10.3)
Chloride: 103 mmol/L (ref 98–111)
Creatinine, Ser: 0.8 mg/dL (ref 0.44–1.00)
GFR, Estimated: >60 mL/min (ref >60)
Glucose, Bld: 84 mg/dL (ref 70–99)
Potassium: 3.8 mmol/L (ref 3.5–5.1)
Sodium: 139 mmol/L (ref 135–145)

## 2024-01-17 NOTE — Progress Notes (Signed)

## 2024-01-19 ENCOUNTER — Ambulatory Visit (HOSPITAL_BASED_OUTPATIENT_CLINIC_OR_DEPARTMENT_OTHER): Admitting: Anesthesiology

## 2024-01-19 ENCOUNTER — Ambulatory Visit (HOSPITAL_BASED_OUTPATIENT_CLINIC_OR_DEPARTMENT_OTHER)
Admission: RE | Admit: 2024-01-19 | Discharge: 2024-01-19 | Disposition: A | Discharge status: Home / Self Care | Attending: Otolaryngology | Admitting: Otolaryngology

## 2024-01-19 ENCOUNTER — Encounter (HOSPITAL_BASED_OUTPATIENT_CLINIC_OR_DEPARTMENT_OTHER)
Admission: RE | Disposition: A | Discharge status: Home / Self Care | Payer: Self-pay | Source: Home / Self Care | Attending: Otolaryngology

## 2024-01-19 ENCOUNTER — Other Ambulatory Visit: Payer: Self-pay

## 2024-01-19 ENCOUNTER — Encounter (HOSPITAL_BASED_OUTPATIENT_CLINIC_OR_DEPARTMENT_OTHER): Payer: Self-pay | Admitting: Otolaryngology

## 2024-01-19 DIAGNOSIS — Z6841 Body Mass Index (BMI) 40.0 and over, adult: Secondary | ICD-10-CM | POA: Insufficient documentation

## 2024-01-19 DIAGNOSIS — I10 Essential (primary) hypertension: Secondary | ICD-10-CM | POA: Diagnosis not present

## 2024-01-19 DIAGNOSIS — E66813 Obesity, class 3: Secondary | ICD-10-CM | POA: Diagnosis not present

## 2024-01-19 DIAGNOSIS — F17200 Nicotine dependence, unspecified, uncomplicated: Secondary | ICD-10-CM | POA: Diagnosis not present

## 2024-01-19 DIAGNOSIS — Q180 Sinus, fistula and cyst of branchial cleft: Secondary | ICD-10-CM | POA: Insufficient documentation

## 2024-01-19 HISTORY — PX: EAR CYST EXCISION: SHX22

## 2024-01-19 SURGERY — EXCISION, BRANCHIAL CLEFT CYST
Anesthesia: General | Site: Face | Laterality: Right

## 2024-01-19 MED ORDER — CLINDAMYCIN PHOSPHATE 900 MG/50ML IV SOLN
INTRAVENOUS | Status: AC
  Filled 2024-01-19: qty 50

## 2024-01-19 MED ORDER — OXYCODONE HCL 5 MG/5ML PO SOLN: 5.0000 mg | Freq: Once | ORAL | Status: DC | PRN

## 2024-01-19 MED ORDER — BUPIVACAINE-EPINEPHRINE (PF) 0.25% -1:200000 IJ SOLN
INTRAMUSCULAR | Status: DC | PRN
  Administered 2024-01-19: 3 mL via PERINEURAL

## 2024-01-19 MED ORDER — LACTATED RINGERS IV SOLN: INTRAVENOUS | Status: DC

## 2024-01-19 MED ORDER — FENTANYL CITRATE (PF) 100 MCG/2ML IJ SOLN
INTRAMUSCULAR | Status: AC
  Filled 2024-01-19: qty 2

## 2024-01-19 MED ORDER — 0.9 % SODIUM CHLORIDE (POUR BTL) OPTIME
TOPICAL | Status: DC | PRN
  Administered 2024-01-19: 120 mL

## 2024-01-19 MED ORDER — SODIUM CHLORIDE 0.9 % IV SOLN
12.5000 mg | INTRAVENOUS | Status: DC | PRN
  Filled 2024-01-19: qty 0.5

## 2024-01-19 MED ORDER — PROPOFOL 10 MG/ML IV BOLUS
INTRAVENOUS | Status: AC
  Filled 2024-01-19: qty 20

## 2024-01-19 MED ORDER — ACETAMINOPHEN 500 MG PO TABS
1000.0000 mg | ORAL_TABLET | Freq: Once | ORAL | Status: AC
  Administered 2024-01-19: 1000 mg via ORAL

## 2024-01-19 MED ORDER — ALBUTEROL SULFATE HFA 108 (90 BASE) MCG/ACT IN AERS
INHALATION_SPRAY | RESPIRATORY_TRACT | Status: DC | PRN
  Administered 2024-01-19 (×2): 2 via RESPIRATORY_TRACT

## 2024-01-19 MED ORDER — BACITRACIN ZINC 500 UNIT/GM EX OINT
TOPICAL_OINTMENT | CUTANEOUS | Status: DC | PRN
  Administered 2024-01-19: 1 via TOPICAL

## 2024-01-19 MED ORDER — AMISULPRIDE (ANTIEMETIC) 5 MG/2ML IV SOLN: 10.0000 mg | Freq: Once | INTRAVENOUS | Status: DC | PRN

## 2024-01-19 MED ORDER — DEXAMETHASONE SODIUM PHOSPHATE 4 MG/ML IJ SOLN
INTRAMUSCULAR | Status: DC | PRN
  Administered 2024-01-19: 5 mg via INTRAVENOUS

## 2024-01-19 MED ORDER — OXYCODONE HCL 5 MG PO TABS: 5.0000 mg | ORAL_TABLET | Freq: Once | ORAL | Status: DC | PRN

## 2024-01-19 MED ORDER — DEXAMETHASONE SODIUM PHOSPHATE 10 MG/ML IJ SOLN
INTRAMUSCULAR | Status: AC
  Filled 2024-01-19: qty 1

## 2024-01-19 MED ORDER — PHENYLEPHRINE HCL (PRESSORS) 10 MG/ML IV SOLN
INTRAVENOUS | Status: DC | PRN
  Administered 2024-01-19 (×4): 80 ug via INTRAVENOUS

## 2024-01-19 MED ORDER — ROCURONIUM BROMIDE 100 MG/10ML IV SOLN
INTRAVENOUS | Status: DC | PRN
  Administered 2024-01-19: 50 mg via INTRAVENOUS

## 2024-01-19 MED ORDER — ONDANSETRON HCL 4 MG/2ML IJ SOLN
INTRAMUSCULAR | Status: DC | PRN
  Administered 2024-01-19: 4 mg via INTRAVENOUS

## 2024-01-19 MED ORDER — FENTANYL CITRATE (PF) 100 MCG/2ML IJ SOLN
25.0000 ug | INTRAMUSCULAR | Status: DC | PRN
  Administered 2024-01-19: 25 ug via INTRAVENOUS

## 2024-01-19 MED ORDER — LIDOCAINE HCL (CARDIAC) PF 100 MG/5ML IV SOSY
PREFILLED_SYRINGE | INTRAVENOUS | Status: DC | PRN
  Administered 2024-01-19: 60 mg via INTRAVENOUS

## 2024-01-19 MED ORDER — MIDAZOLAM HCL 5 MG/5ML IJ SOLN
INTRAMUSCULAR | Status: DC | PRN
  Administered 2024-01-19: 2 mg via INTRAVENOUS

## 2024-01-19 MED ORDER — PROPOFOL 10 MG/ML IV BOLUS
INTRAVENOUS | Status: DC | PRN
  Administered 2024-01-19: 200 mg via INTRAVENOUS

## 2024-01-19 MED ORDER — LIDOCAINE-EPINEPHRINE 1 %-1:100000 IJ SOLN
INTRAMUSCULAR | Status: AC
  Filled 2024-01-19: qty 1

## 2024-01-19 MED ORDER — ACETAMINOPHEN 500 MG PO TABS
ORAL_TABLET | ORAL | Status: AC
  Filled 2024-01-19: qty 2

## 2024-01-19 MED ORDER — CLINDAMYCIN PHOSPHATE 900 MG/50ML IV SOLN
900.0000 mg | INTRAVENOUS | Status: AC
  Administered 2024-01-19: 900 mg via INTRAVENOUS

## 2024-01-19 MED ORDER — MIDAZOLAM HCL 2 MG/2ML IJ SOLN
INTRAMUSCULAR | Status: AC
  Filled 2024-01-19: qty 2

## 2024-01-19 MED ORDER — OXYCODONE HCL 5 MG PO TABS: 5.0000 mg | ORAL_TABLET | Freq: Four times a day (QID) | ORAL | 0 refills | Status: AC | PRN

## 2024-01-19 MED ORDER — ONDANSETRON HCL 4 MG/2ML IJ SOLN
INTRAMUSCULAR | Status: AC
  Filled 2024-01-19: qty 2

## 2024-01-19 MED ORDER — SUGAMMADEX SODIUM 200 MG/2ML IV SOLN
INTRAVENOUS | Status: DC | PRN
  Administered 2024-01-19: 200 mg via INTRAVENOUS

## 2024-01-19 MED ORDER — BUPIVACAINE HCL (PF) 0.25 % IJ SOLN
INTRAMUSCULAR | Status: AC
Start: 2024-01-19 — End: ?
  Filled 2024-01-19: qty 30

## 2024-01-19 SURGICAL SUPPLY — 58 items
APPLICATOR DR MATTHEWS STRL (MISCELLANEOUS) ×1 IMPLANT
BLADE CLIPPER SURG (BLADE) IMPLANT
BLADE SURG 15 STRL LF DISP TIS (BLADE) ×2 IMPLANT
CANISTER SUCT 1200ML W/VALVE (MISCELLANEOUS) ×1 IMPLANT
CLIP TI MEDIUM 6 (CLIP) IMPLANT
CLIP TI WIDE RED SMALL 6 (CLIP) IMPLANT
CORD BIPOLAR FORCEPS 12FT (ELECTRODE) IMPLANT
COVER BACK TABLE 60X90IN (DRAPES) ×1 IMPLANT
COVER MAYO STAND STRL (DRAPES) ×1 IMPLANT
DERMABOND ADVANCED .7 DNX12 (GAUZE/BANDAGES/DRESSINGS) IMPLANT
DRAPE U-SHAPE 76X120 STRL (DRAPES) ×1 IMPLANT
DRSG TELFA 3X8 NADH STRL (GAUZE/BANDAGES/DRESSINGS) IMPLANT
ELECT COATED BLADE 2.86 ST (ELECTRODE) IMPLANT
ELECT NDL BLADE 2-5/6 (NEEDLE) ×1 IMPLANT
ELECT NEEDLE BLADE 2-5/6 (NEEDLE) ×1 IMPLANT
ELECTRODE REM PT RTRN 9FT ADLT (ELECTROSURGICAL) ×1 IMPLANT
FORCEPS BIPOLAR SPETZLER 8 1.0 (NEUROSURGERY SUPPLIES) IMPLANT
GAUZE 4X4 16PLY ~~LOC~~+RFID DBL (SPONGE) IMPLANT
GAUZE SPONGE 2X2 STRL 8-PLY (GAUZE/BANDAGES/DRESSINGS) IMPLANT
GAUZE SPONGE 4X4 12PLY STRL (GAUZE/BANDAGES/DRESSINGS) IMPLANT
GAUZE SPONGE 4X4 12PLY STRL LF (GAUZE/BANDAGES/DRESSINGS) IMPLANT
GLOVE BIO SURGEON STRL SZ7.5 (GLOVE) ×1 IMPLANT
GLOVE BIOGEL PI IND STRL 6.5 (GLOVE) IMPLANT
GLOVE BIOGEL PI IND STRL 7.0 (GLOVE) IMPLANT
GLOVE BIOGEL PI IND STRL 7.5 (GLOVE) IMPLANT
GLOVE BIOGEL PI IND STRL 8 (GLOVE) ×1 IMPLANT
GLOVE SURG SYN 7.5 E (GLOVE) ×1 IMPLANT
GLOVE SURG SYN 7.5 PF PI (GLOVE) IMPLANT
GOWN STRL REUS W/ TWL LRG LVL3 (GOWN DISPOSABLE) ×1 IMPLANT
GOWN STRL REUS W/ TWL XL LVL3 (GOWN DISPOSABLE) ×1 IMPLANT
MARKER SKIN DUAL TIP RULER LAB (MISCELLANEOUS) IMPLANT
NDL PRECISIONGLIDE 27X1.5 (NEEDLE) ×1 IMPLANT
NDL SAFETY ECLIPSE 18X1.5 (NEEDLE) IMPLANT
NEEDLE PRECISIONGLIDE 27X1.5 (NEEDLE) ×1 IMPLANT
NS IRRIG 1000ML POUR BTL (IV SOLUTION) ×1 IMPLANT
PACK BASIN DAY SURGERY FS (CUSTOM PROCEDURE TRAY) ×1 IMPLANT
PENCIL SMOKE EVACUATOR (MISCELLANEOUS) ×1 IMPLANT
SHEET MEDIUM DRAPE 40X70 STRL (DRAPES) IMPLANT
SLEEVE SCD COMPRESS KNEE MED (STOCKING) IMPLANT
SPIKE FLUID TRANSFER (MISCELLANEOUS) IMPLANT
SUCTION TUBE FRAZIER 10FR DISP (SUCTIONS) IMPLANT
SUT ETHILON 5 0 PS 2 18 (SUTURE) IMPLANT
SUT ETHILON 6 0 PS 3 18 (SUTURE) IMPLANT
SUT MNCRL AB 4-0 PS2 18 (SUTURE) IMPLANT
SUT MON AB 5-0 P3 18 (SUTURE) IMPLANT
SUT NYLON ETHILON 5-0 P-3 1X18 (SUTURE) IMPLANT
SUT SILK 3 0 REEL (SUTURE) IMPLANT
SUT VIC AB 4-0 P-3 18XBRD (SUTURE) IMPLANT
SUT VIC AB 4-0 PS2 18 (SUTURE) IMPLANT
SWAB COLLECTION DEVICE MRSA (MISCELLANEOUS) IMPLANT
SWAB CULTURE ESWAB REG 1ML (MISCELLANEOUS) IMPLANT
SYR BULB EAR ULCER 3OZ GRN STR (SYRINGE) ×1 IMPLANT
SYR CONTROL 10ML LL (SYRINGE) ×1 IMPLANT
TAPE PAPER 1/2X10 TAN MEDIPORE (MISCELLANEOUS) IMPLANT
TOWEL GREEN STERILE FF (TOWEL DISPOSABLE) ×1 IMPLANT
TRAY DSU PREP LF (CUSTOM PROCEDURE TRAY) ×1 IMPLANT
TUBE CONNECTING 20X1/4 (TUBING) ×1 IMPLANT
YANKAUER SUCT BULB TIP NO VENT (SUCTIONS) IMPLANT

## 2024-01-19 NOTE — Transfer of Care (Addendum)
 Immediate Anesthesia Transfer of Care Note  Patient: Vanessa Benitez  Procedure(s) Performed: EXCISION, BRANCHIAL CLEFT CYST (Right: Face)  Patient Location: PACU  Anesthesia Type:General  Level of Consciousness: sedated  Airway & Oxygen Therapy: Patient Spontanous Breathing and Patient connected to face mask oxygen  Post-op Assessment: Report given to RN and Post -op Vital signs reviewed and stable  Post vital signs: Reviewed and stable  Last Vitals:  Vitals Value Taken Time  BP 137/97 01/19/24 1100  Temp 36.3 C 01/19/24 1019  Pulse 82 01/19/24 1102  Resp 15 01/19/24 1102  SpO2 87 % 01/19/24 1102  Vitals shown include unfiled device data.  Last Pain:  Vitals:   01/19/24 1044  TempSrc:   PainSc: 2       Patients Stated Pain Goal: 3 (01/19/24 1044)  Complications: No notable events documented.

## 2024-01-19 NOTE — Anesthesia Procedure Notes (Signed)
 Procedure Name: Intubation Date/Time: 01/19/2024 9:05 AM  Performed by: Arvilla Birmingham, CRNAPre-anesthesia Checklist: Patient identified, Emergency Drugs available, Suction available and Patient being monitored Patient Re-evaluated:Patient Re-evaluated prior to induction Oxygen Delivery Method: Circle system utilized Preoxygenation: Pre-oxygenation with 100% oxygen Induction Type: IV induction Ventilation: Mask ventilation without difficulty Laryngoscope Size: Mac and 3 Grade View: Grade II Tube type: Oral Number of attempts: 1 Airway Equipment and Method: Stylet and Oral airway Placement Confirmation: ETT inserted through vocal cords under direct vision, positive ETCO2 and breath sounds checked- equal and bilateral Secured at: 21 cm Tube secured with: Tape Dental Injury: Teeth and Oropharynx as per pre-operative assessment

## 2024-01-19 NOTE — Discharge Instructions (Addendum)
 Post-operative Patient Instructions Vanessa Benitez. Hoshal MD  Surgery What to expect: A bandage will be placed on your surgical sites. You can leave the bandages in place until you return to the clinic. You may be scheduled for a series of wound care appointments over the next month.  Recovery/Restrictions: -No strenuous activity for at least the first week after your procedure -Bruising and swelling are expected and will take weeks to go away (consider using ice packs) -Please contact our office immediately if you experience any signs/symptoms of infection (redness, pain, or fever of 100.60F or greater)  Wound Wound care: The goal is to keep your wounds clean and moist to prevent scabs or crusts. You will keep your current post-operative dressing in place undisturbed until after your first post-operative clinic visit. The following instructions apply after your first post-operative visit.  1. Clean wound wounds with soap and water using a cue tip if any crusts are present  2. Next, apply a thin layer of Aquaphor ointment 3. Apply Telfa and cover with brown tape  4. If you had an ear surgery - please apply a thin layer of antibiotic ointment or Aquaphor ointment to your ear incision every day  Care Healing Period: For the best healing, please protect the area from the sun   You may be asked to begin massaging the scars several weeks after surgery. Scars can be massaged in horizontal, vertical, and circular motions.   Adult Post-Operative Pain Management  Pain medication is given immediately following your surgery to help with post-operative pain. Do not wake up or set an alarm to wake up and take pain medications. Sleep and rest.  Upon your discharge home, we suggest scheduled doses of Acetaminophen  (Tylenol ) every 6 hours and Ibuprofen  (Motrin ) every 6 hours, alternating between medications every 3 hours (i.e. Take Tylenol  and wait 3 hours, then take Motrin  and wait 3 hours, repeat) for the  first 3-4 days after surgery. If you are without significant pain, medications can be taken more infrequently. It is important to follow dosing instructions on the medication bottle or prescription.   Sample of medication dosing schedule  Give dose of: Time: Given:  Acetaminophen  12 a.m.   Ibuprofen  3 a.m.   Acetaminophen  6 a.m.   Ibuprofen  9 a.m.   Acetaminophen  12 p.m.   Ibuprofen  3 p.m.   Acetaminophen  6 p.m.   Ibuprofen  9 p.m.    If you need to call after clinic hours for a concern, call [phone] and ask for the "physician on call for ENT."  [address] [place], [place] [address] Phone: [phone]    May take Tylenol  after 3pm, if needed.    Post Anesthesia Home Care Instructions  Activity: Get plenty of rest for the remainder of the day. A responsible individual must stay with you for 24 hours following the procedure.  For the next 24 hours, DO NOT: -Drive a car -Advertising copywriter -Drink alcoholic beverages -Take any medication unless instructed by your physician -Make any legal decisions or sign important papers.  Meals: Start with liquid foods such as gelatin or soup. Progress to regular foods as tolerated. Avoid greasy, spicy, heavy foods. If nausea and/or vomiting occur, drink only clear liquids until the nausea and/or vomiting subsides. Call your physician if vomiting continues.  Special Instructions/Symptoms: Your throat may feel dry or sore from the anesthesia or the breathing tube placed in your throat during surgery. If this causes discomfort, gargle with warm salt water. The discomfort  should disappear within 24 hours.  If you had a scopolamine patch placed behind your ear for the management of post- operative nausea and/or vomiting:  1. The medication in the patch is effective for 72 hours, after which it should be removed.  Wrap patch in a tissue and discard in the trash. Wash hands thoroughly with soap and water. 2. You may remove the  patch earlier than 72 hours if you experience unpleasant side effects which may include dry mouth, dizziness or visual disturbances. 3. Avoid touching the patch. Wash your hands with soap and water after contact with the patch.

## 2024-01-19 NOTE — Anesthesia Preprocedure Evaluation (Addendum)
 Anesthesia Evaluation  Patient identified by MRN, date of birth, ID band Patient awake    Reviewed: Allergy & Precautions, NPO status , Patient's Chart, lab work & pertinent test results, reviewed documented beta blocker date and time   History of Anesthesia Complications Negative for: history of anesthetic complications  Airway Mallampati: II  TM Distance: >3 FB Neck ROM: Full    Dental  (+) Dental Advisory Given, Partial Upper, Partial Lower   Pulmonary Current Smoker and Patient abstained from smoking.   Pulmonary exam normal        Cardiovascular hypertension, Pt. on medications and Pt. on home beta blockers Normal cardiovascular exam     Neuro/Psych negative neurological ROS  negative psych ROS   GI/Hepatic negative GI ROS, Neg liver ROS,,,  Endo/Other    Class 3 obesity  Renal/GU negative Renal ROS     Musculoskeletal negative musculoskeletal ROS (+)    Abdominal  (+) + obese  Peds  Hematology negative hematology ROS (+)   Anesthesia Other Findings On Glp-1a   Reproductive/Obstetrics                              Anesthesia Physical Anesthesia Plan  ASA: 3  Anesthesia Plan: General   Post-op Pain Management: Tylenol  PO (pre-op)*   Induction: Intravenous  PONV Risk Score and Plan: 3 and Treatment may vary due to age or medical condition, Ondansetron , Dexamethasone and Midazolam  Airway Management Planned: Oral ETT  Additional Equipment: None  Intra-op Plan:   Post-operative Plan: Extubation in OR  Informed Consent: I have reviewed the patients History and Physical, chart, labs and discussed the procedure including the risks, benefits and alternatives for the proposed anesthesia with the patient or authorized representative who has indicated his/her understanding and acceptance.     Dental advisory given  Plan Discussed with: CRNA and Anesthesiologist  Anesthesia  Plan Comments:          Anesthesia Quick Evaluation

## 2024-01-19 NOTE — Op Note (Signed)
 OPERATIVE NOTE  Kirbi Sirmon Date/Time of Admission: 01/19/2024  8:18 AM  CSN: [id number];MVH:[id number] Attending Provider: Rush Coupe, MD Room/Bed: [facility]SP/NONE DOB: [birth year] Age: 53 y.o.   Pre-Op Diagnosis: Right Branchial cleft sinus  Post-Op Diagnosis: Right Branchial cleft sinus  Procedure: Procedure(s): EXCISION RIGHT BRANCHIAL CLEFT SINUS EXTENDING TO DEEP TISSUES CPT 42815  Anesthesia: General  Surgeon(s): Marijane Shoulders, MD  Staff: Circulator: Glynn Lasso, RN Relief Circulator: McDonough-Hughes, Jodi C, RN Relief Scrub: Irven Manson Scrub Person: Chrystie Crass A  Implants: * No implants in log *  Specimens: ID Type Source Tests Collected by Time Destination  1 : Right Branchial Cleft Sinus Tissue PATH Soft tissue SURGICAL PATHOLOGY Rush Coupe, MD 01/19/2024 [phone]     Complications: none  EBL: 20 ML  IVF: Per anesthesia ML  Condition: stable  Operative Findings:  Right previously incised scarred branchial cleft sinus tract extending to level of perichondrium and sub-SMAS fascia layer/parotid gland, completely excised  Description of Operation:  The patient was identified in the preoperative area and consent confirmed in the chart.  She was brought to the operating room by the anesthetist and a preoperative timeout was performed confirming the patient identity and procedure to be performed.  Once all were in agreement we proceeded with surgery.  General anesthesia was induced and the patient intubated with an oral endotracheal tube.  The patient's head was turned to the left exposing the right ear.  A fusiform incision approximately 3 x 1 cm was marked out overlying the branchial cleft sinus tract in front of the ear including the prior incision and drainage site.  The area was infiltrated with quarter percent Marcaine 1 100,000 epinephrine . The patient was prepped and draped in standard sterile fashion for procedure of this kind.  A  final timeout was performed and we proceeded with surgery.  First a 0 ought lacrimal probe was placed through the pre-auricular sinus.  Next a fusiform incision 3 x 1 cm was created with a 15 blade scalpel.  Double-pronged skin hooks were then placed.  A combination of blunt and sharp dissection with tenotomy scissors was used to dissect out the preauricular sinus tract.  There was dense adherence and scar tissue.  The the superficial temporal veins were encountered and ligated with 3-0 silk ties.  Dissection performed circumferentially using the Bovie for hemostasis.  The sinus tract was dissected off of the preauricular cartilage and dissected off of the parotid tissue below the sub-SMAS plane.  The mass was circumferentially released and sent for permanent pathology.  The anterior flap was dissected sharply with a 15 blade in a subcutaneous plane approximately 3 cm to allow for tension-free closure.  The wound was copiously irrigated and Valsalva performed confirming hemostasis.  The wound was closed in a layered fashion using buried interrupted 4-0 Vicryl and 5-0 Monocryl for the deep dermal subcutaneous layer and a running 6-0 nylon for the skin.  A dressing consisting of bacitracin, Telfa, proper tape was applied.  The patient was then returned back to anesthesia extubated and brought to the recovery room in stable condition.  Marijane Shoulders, MD [facility]  01/19/2024

## 2024-01-19 NOTE — Anesthesia Postprocedure Evaluation (Signed)
 Anesthesia Post Note  Patient: Vanessa Benitez  Procedure(s) Performed: EXCISION, BRANCHIAL CLEFT CYST (Right: Face)     Patient location during evaluation: PACU Anesthesia Type: General Level of consciousness: awake and alert Pain management: pain level controlled Vital Signs Assessment: post-procedure vital signs reviewed and stable Respiratory status: spontaneous breathing, nonlabored ventilation and respiratory function stable Cardiovascular status: stable and blood pressure returned to baseline Anesthetic complications: no   No notable events documented.  Last Vitals:  Vitals:   01/19/24 1100 01/19/24 1117  BP: (!) 137/97 133/85  Pulse: 70 72  Resp: 15 18  Temp:    SpO2: 92% 93%    Last Pain:  Vitals:   01/19/24 1113  TempSrc:   PainSc: 5                  Juventino Oppenheim

## 2024-01-19 NOTE — H&P (Signed)
 Vanessa Benitez is an 53 y.o. female.    Chief Complaint:  Branchial cleft sinus  HPI: Patient presents today for planned elective procedure.  He/she denies any interval change in history since office visit on 12/02/23.   Past Medical History:  Diagnosis Date   Hypertension     Past Surgical History:  Procedure Laterality Date   CHOLECYSTECTOMY     cyst removed      Family History  Problem Relation Age of Onset   Hypertension Other    Cancer Other    Diabetes Other     Social History:  reports that she has never smoked. She has never used smokeless tobacco. She reports current alcohol use. She reports that she does not use drugs.  Allergies:  Allergies  Allergen Reactions   Lactose Nausea And Vomiting    G.I. Upset G.I. Upset G.I. Upset G.I. Upset    Aspirin    Lactose Intolerance (Gi)     G.I. Upset   Lisinopril Other (See Comments)    Sore throat, cough   Penicillins     Has patient had a PCN reaction causing immediate rash, facial/tongue/throat swelling, SOB or lightheadedness with hypotension: no Has patient had a PCN reaction causing severe rash involving mucus membranes or skin necrosis: no Has patient had a PCN reaction that required hospitalization: no Has patient had a PCN reaction occurring within the last 10 years: yes, December 2017 If all of the above answers are "NO", then may proceed with Cephalosporin use.   Sulfa Antibiotics Hives and Diarrhea    Medications Prior to Admission  Medication Sig Dispense Refill   albuterol  (VENTOLIN  HFA) 108 (90 Base) MCG/ACT inhaler Inhale 1-2 puffs into the lungs every 6 (six) hours as needed for wheezing or shortness of breath. 18 g 0   amLODipine (NORVASC) 5 MG tablet Take 7.5 mg by mouth daily.     Cholecalciferol (VITAMIN D PO) Take 1 tablet by mouth daily.     DULoxetine (CYMBALTA) 20 MG capsule Take 20 mg by mouth daily.     metoprolol tartrate (LOPRESSOR) 50 MG tablet Take 50 mg by mouth 2 (two) times  daily.     Semaglutide-Weight Management (WEGOVY [place]) Inject into the skin.     triamterene -hydrochlorothiazide (MAXZIDE-25) 37.5-25 MG tablet Take 1 tablet by mouth daily. 90 tablet 0   acetaminophen  (TYLENOL ) 500 MG tablet Take 500 mg by mouth every 6 (six) hours as needed for moderate pain or fever.     benzonatate  (TESSALON ) 100 MG capsule Take 1-2 capsules (100-200 mg total) by mouth 3 (three) times daily as needed for cough. 60 capsule 0   doxycycline  (VIBRAMYCIN ) 100 MG capsule Take 1 capsule (100 mg total) by mouth 2 (two) times daily. One po bid x 7 days 14 capsule 0   ELDERBERRY PO Take 10 mLs by mouth daily as needed (For immune support).     fluconazole  (DIFLUCAN ) 150 MG tablet Take 1 tablet (150 mg total) by mouth daily. 1 tablet 1    Results for orders placed or performed during the hospital encounter of 01/19/24 (from the past 48 hours)  Basic metabolic panel per protocol     Status: None   Collection Time: 01/17/24 10:38 AM  Result Value Ref Range   Sodium 139 135 - 145 mmol/L   Potassium 3.8 3.5 - 5.1 mmol/L   Chloride 103 98 - 111 mmol/L   CO2 27 22 - 32 mmol/L   Glucose, Bld 84 70 - 99  mg/dL    Comment: Glucose reference range applies only to samples taken after fasting for at least 8 hours.   BUN 11 6 - 20 mg/dL   Creatinine, Ser 1.61 0.44 - 1.00 mg/dL   Calcium 9.3 8.9 - 09.6 mg/dL   GFR, Estimated >04 >54 mL/min    Comment: (NOTE) Calculated using the CKD-EPI Creatinine Equation (2021)    Anion gap 9 5 - 15    Comment: Performed at [facility], [address] [place], [place] [address]   No results found.  ROS: negative other than stated in HPI  Height 5' (1.524 m), weight 101.6 kg, last menstrual period 11/15/2019.  PHYSICAL EXAM: General: Resting comfortably in NAD  Lungs: Non-labored respiratinos  Studies Reviewed: none   Assessment/Plan Infected branchial cleft sinus , right  Proceed with excision of right branchial cleft sinus under  anesthesia.     Electronically signed by:  Rush Coupe, MD  Staff Physician Facial Plastic & Reconstructive Surgery Otolaryngology - Head and Neck Surgery [facility]  01/19/2024, 8:25 AM

## 2024-01-20 ENCOUNTER — Encounter (HOSPITAL_BASED_OUTPATIENT_CLINIC_OR_DEPARTMENT_OTHER): Payer: Self-pay | Admitting: Otolaryngology

## 2024-01-20 LAB — SURGICAL PATHOLOGY
